# Patient Record
Sex: Male | Born: 1965
Health system: Southern US, Community
[De-identification: ages and names within clinical notes are randomized; demographics above are authoritative.]

## PROBLEM LIST (undated history)

## (undated) DIAGNOSIS — J45909 Unspecified asthma, uncomplicated: Secondary | ICD-10-CM

## (undated) DIAGNOSIS — IMO0002 Reserved for concepts with insufficient information to code with codable children: Secondary | ICD-10-CM

## (undated) DIAGNOSIS — J398 Other specified diseases of upper respiratory tract: Secondary | ICD-10-CM

## (undated) DIAGNOSIS — I509 Heart failure, unspecified: Secondary | ICD-10-CM

## (undated) DIAGNOSIS — F17200 Nicotine dependence, unspecified, uncomplicated: Secondary | ICD-10-CM

## (undated) DIAGNOSIS — J449 Chronic obstructive pulmonary disease, unspecified: Secondary | ICD-10-CM

## (undated) DIAGNOSIS — F1021 Alcohol dependence, in remission: Secondary | ICD-10-CM

## (undated) DIAGNOSIS — F411 Generalized anxiety disorder: Secondary | ICD-10-CM

## (undated) DIAGNOSIS — G43909 Migraine, unspecified, not intractable, without status migrainosus: Secondary | ICD-10-CM

## (undated) DIAGNOSIS — I10 Essential (primary) hypertension: Secondary | ICD-10-CM

## (undated) DIAGNOSIS — T1490XA Injury, unspecified, initial encounter: Secondary | ICD-10-CM

## (undated) DIAGNOSIS — R911 Solitary pulmonary nodule: Secondary | ICD-10-CM

## (undated) HISTORY — DX: Migraine, unspecified, not intractable, without status migrainosus: G43.909

## (undated) HISTORY — DX: Essential (primary) hypertension: I10

## (undated) HISTORY — DX: Generalized anxiety disorder: F41.1

## (undated) HISTORY — DX: Injury, unspecified, initial encounter: T14.90XA

## (undated) HISTORY — DX: Heart failure, unspecified: I50.9

## (undated) HISTORY — DX: Nicotine dependence, unspecified, uncomplicated: F17.200

## (undated) HISTORY — DX: Other specified diseases of upper respiratory tract: J39.8

## (undated) HISTORY — DX: Solitary pulmonary nodule: R91.1

## (undated) HISTORY — DX: Alcohol dependence, in remission: F10.21

## (undated) HISTORY — DX: Reserved for concepts with insufficient information to code with codable children: IMO0002

## (undated) HISTORY — DX: Chronic obstructive pulmonary disease, unspecified: J44.9

---

## 2004-07-03 ENCOUNTER — Ambulatory Visit: Payer: Self-pay | Admitting: Family Medicine

## 2004-09-20 ENCOUNTER — Ambulatory Visit: Payer: Self-pay | Admitting: Internal Medicine

## 2004-10-02 ENCOUNTER — Ambulatory Visit: Payer: Self-pay | Admitting: Family Medicine

## 2004-10-18 ENCOUNTER — Ambulatory Visit: Payer: Self-pay | Admitting: Internal Medicine

## 2005-02-26 ENCOUNTER — Ambulatory Visit: Payer: Self-pay | Admitting: Family Medicine

## 2005-05-23 ENCOUNTER — Ambulatory Visit: Payer: Self-pay | Admitting: Family Medicine

## 2005-08-22 ENCOUNTER — Ambulatory Visit: Payer: Self-pay | Admitting: Family Medicine

## 2006-01-01 ENCOUNTER — Ambulatory Visit: Payer: Self-pay | Admitting: Family Medicine

## 2006-01-10 ENCOUNTER — Ambulatory Visit: Payer: Self-pay | Admitting: Family Medicine

## 2006-01-30 ENCOUNTER — Ambulatory Visit: Payer: Self-pay

## 2006-07-24 ENCOUNTER — Ambulatory Visit: Payer: Self-pay | Admitting: Family Medicine

## 2011-02-10 DIAGNOSIS — T1490XA Injury, unspecified, initial encounter: Secondary | ICD-10-CM

## 2011-02-10 DIAGNOSIS — R911 Solitary pulmonary nodule: Secondary | ICD-10-CM

## 2011-02-10 DIAGNOSIS — J398 Other specified diseases of upper respiratory tract: Secondary | ICD-10-CM | POA: Insufficient documentation

## 2011-02-10 HISTORY — DX: Solitary pulmonary nodule: R91.1

## 2011-02-10 HISTORY — DX: Other specified diseases of upper respiratory tract: J39.8

## 2011-02-10 HISTORY — PX: OTHER SURGICAL HISTORY: SHX169

## 2011-02-10 HISTORY — DX: Injury, unspecified, initial encounter: T14.90XA

## 2011-03-05 LAB — CBC AND DIFFERENTIAL
MCV: 91 fL
RDW: 14.4
WBC: 11.8
platelet count: 509

## 2011-03-05 LAB — COMPREHENSIVE METABOLIC PANEL
Chloride: 100 mmol/L
Glucose: 110
Potassium: 4.7 mmol/L
Sodium: 138 mmol/L (ref 137–147)

## 2011-03-12 ENCOUNTER — Encounter: Payer: Self-pay | Admitting: Pain Medicine

## 2011-03-27 ENCOUNTER — Encounter: Payer: Self-pay | Admitting: Family Medicine

## 2011-03-27 ENCOUNTER — Ambulatory Visit (INDEPENDENT_AMBULATORY_CARE_PROVIDER_SITE_OTHER): Payer: Self-pay | Admitting: Family Medicine

## 2011-03-27 VITALS — BP 124/82 | HR 80 | Temp 97.6°F | Ht 74.0 in | Wt 248.0 lb

## 2011-03-27 DIAGNOSIS — F17200 Nicotine dependence, unspecified, uncomplicated: Secondary | ICD-10-CM

## 2011-03-27 DIAGNOSIS — J398 Other specified diseases of upper respiratory tract: Secondary | ICD-10-CM

## 2011-03-27 DIAGNOSIS — J984 Other disorders of lung: Secondary | ICD-10-CM

## 2011-03-27 DIAGNOSIS — T1490XA Injury, unspecified, initial encounter: Secondary | ICD-10-CM

## 2011-03-27 DIAGNOSIS — R9389 Abnormal findings on diagnostic imaging of other specified body structures: Secondary | ICD-10-CM

## 2011-03-27 DIAGNOSIS — Z720 Tobacco use: Secondary | ICD-10-CM | POA: Insufficient documentation

## 2011-03-27 DIAGNOSIS — F1021 Alcohol dependence, in remission: Secondary | ICD-10-CM

## 2011-03-27 DIAGNOSIS — R222 Localized swelling, mass and lump, trunk: Secondary | ICD-10-CM

## 2011-03-27 DIAGNOSIS — K219 Gastro-esophageal reflux disease without esophagitis: Secondary | ICD-10-CM | POA: Insufficient documentation

## 2011-03-27 DIAGNOSIS — IMO0002 Reserved for concepts with insufficient information to code with codable children: Secondary | ICD-10-CM

## 2011-03-27 DIAGNOSIS — J309 Allergic rhinitis, unspecified: Secondary | ICD-10-CM

## 2011-03-27 DIAGNOSIS — R911 Solitary pulmonary nodule: Secondary | ICD-10-CM

## 2011-03-27 MED ORDER — NAPROXEN 500 MG PO TABS: ORAL_TABLET | ORAL | Status: DC

## 2011-03-27 NOTE — Assessment & Plan Note (Signed)
Rpt neck CT.
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
## 2011-03-27 NOTE — Assessment & Plan Note (Signed)
Encouraged sobriety.

## 2011-03-27 NOTE — Patient Instructions (Addendum)
Pass by Marion's office to set up repeat CT scan of neck and kidney ultrasound. Good to meet you today, call us with questions.

## 2011-03-27 NOTE — Assessment & Plan Note (Addendum)
rec continued f/u with PT/OT as well as neurology. 1 suture removed today. Reviewed f/u plans from hosptial -  1. Solitary R 6mm pulm nodule, rec recheck CT scan 06/2011.  Never set up in [facility] pulm nodule clinic. 2. Right kidney hypodensity that needs f/u - ordered abd [place] to better visualize. 3. Tracheal debris, ? Mass.  rec rpt CT scan neck.  Ordered today. For blood in stool advised pt to return in 2 wks for recheck, will discuss then.  Longstanding.
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
## 2011-03-27 NOTE — Assessment & Plan Note (Signed)
Encouraged cessation, discussed ways to quit with this.

## 2011-03-27 NOTE — Assessment & Plan Note (Signed)
Rpt CT due ~06/2011

## 2011-03-27 NOTE — Patient Instructions (Addendum)
We need to set you up with repeat CT scan of lungs for January to follow spot that they saw. [facility] also recommended repeating neck CT to check spot on trachea and abdominal ultrasound to check spot on right kidney. We need to schedule you for colonoscopy.  We will do this when you return to get set up. You need to quit smoking! Pass by Marion's office to set up repeat CT scan of neck and kidney ultrasound.  Make appointment with neurologist to check on memory.  Keep appointments with physical therapy. Good to meet you today, call us with questions. Return to see me in 2-3 weeks to talk about all these issues (30 min appointment).

## 2011-03-28 ENCOUNTER — Encounter: Payer: Self-pay | Admitting: Family Medicine

## 2011-03-29 ENCOUNTER — Other Ambulatory Visit: Payer: Self-pay | Admitting: Family Medicine

## 2011-03-29 DIAGNOSIS — N289 Disorder of kidney and ureter, unspecified: Secondary | ICD-10-CM

## 2011-03-29 NOTE — Progress Notes (Signed)
  Subjective:    Patient ID: Chad Gibbs, male    DOB: [birth year], 45 y.o.   MRN: [id number]  HPI  ** THIS APPT ACCIDENTALLY CANCELLED  PT ARRIVED LATE.  SEE OTHER NOTE FOR FURTHER INFORMATION  Review of Systems     Objective:   Physical Exam        Assessment & Plan:

## 2011-04-10 ENCOUNTER — Ambulatory Visit (INDEPENDENT_AMBULATORY_CARE_PROVIDER_SITE_OTHER): Payer: Self-pay | Admitting: Family Medicine

## 2011-04-10 ENCOUNTER — Encounter: Payer: Self-pay | Admitting: Family Medicine

## 2011-04-10 VITALS — BP 144/84 | HR 76 | Temp 97.8°F | Wt 254.4 lb

## 2011-04-10 DIAGNOSIS — J984 Other disorders of lung: Secondary | ICD-10-CM

## 2011-04-10 DIAGNOSIS — Z1211 Encounter for screening for malignant neoplasm of colon: Secondary | ICD-10-CM

## 2011-04-10 DIAGNOSIS — J3489 Other specified disorders of nose and nasal sinuses: Secondary | ICD-10-CM

## 2011-04-10 DIAGNOSIS — K921 Melena: Secondary | ICD-10-CM | POA: Insufficient documentation

## 2011-04-10 DIAGNOSIS — J398 Other specified diseases of upper respiratory tract: Secondary | ICD-10-CM

## 2011-04-10 DIAGNOSIS — R911 Solitary pulmonary nodule: Secondary | ICD-10-CM

## 2011-04-10 DIAGNOSIS — F17200 Nicotine dependence, unspecified, uncomplicated: Secondary | ICD-10-CM

## 2011-04-10 DIAGNOSIS — R0981 Nasal congestion: Secondary | ICD-10-CM

## 2011-04-10 DIAGNOSIS — R222 Localized swelling, mass and lump, trunk: Secondary | ICD-10-CM

## 2011-04-10 NOTE — Assessment & Plan Note (Signed)
Continue to encourage cessation. 

## 2011-04-10 NOTE — Assessment & Plan Note (Signed)
Pending pulm CT due 06/2011.

## 2011-04-10 NOTE — Assessment & Plan Note (Signed)
Pending neck CT.

## 2011-04-10 NOTE — Assessment & Plan Note (Signed)
Anticipate early sinusitis.   Advised notify us if fever, not improving as expected. Offered abx, pt declines for now. rec robitussin, nasal saline.

## 2011-04-10 NOTE — Patient Instructions (Addendum)
Make appointment with neurology. Keep appointment tomorrow for check on kidneys at [facility].. Your stool tested positive for blood.  I do want to set you up with colonoscopy.  Pass by Marion's office to set this up. If congestion is not getting better, or fever >101 let me know. Try robitussin with plenty of fluid as well as nasal saline irrigation over the counter for sinus congestion.

## 2011-04-10 NOTE — Assessment & Plan Note (Signed)
hemoccult positive today in clinic.  Denies BM changes. Smoker. Refer for colonoscopy. Small ext hemorrhoid on exam today, not inflammed.
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
## 2011-04-12 ENCOUNTER — Encounter: Payer: Self-pay | Admitting: Pain Medicine

## 2011-05-14 ENCOUNTER — Other Ambulatory Visit: Payer: Self-pay | Admitting: *Deleted

## 2011-05-14 MED ORDER — NAPROXEN 500 MG PO TABS: 500.0000 mg | ORAL_TABLET | Freq: Two times a day (BID) | ORAL | Status: DC

## 2011-05-14 NOTE — Telephone Encounter (Signed)
Sent in

## 2011-05-14 NOTE — Telephone Encounter (Signed)
Received faxed refill request from pharmacy for Naproxen 500 mg, take one by mouth three times a day as needed for pain with food LR 03/27/2011.  Medication is not on medication sheet. Is it okay to refill medication

## 2012-02-24 ENCOUNTER — Encounter: Payer: Self-pay | Admitting: Family Medicine

## 2012-02-24 DIAGNOSIS — F411 Generalized anxiety disorder: Secondary | ICD-10-CM | POA: Insufficient documentation

## 2016-11-02 ENCOUNTER — Telehealth: Payer: Self-pay | Admitting: Family Medicine

## 2016-11-02 NOTE — Telephone Encounter (Signed)
Called to schedule NP Appt as patient requested.  Phone# [phone].  Patient did not answer telephone after 3 attempts and voicemail states" has not been set up yet".

## 2016-12-18 ENCOUNTER — Encounter: Payer: Self-pay | Admitting: Family Medicine

## 2016-12-18 ENCOUNTER — Ambulatory Visit (INDEPENDENT_AMBULATORY_CARE_PROVIDER_SITE_OTHER): Payer: Medicare HMO | Admitting: Family Medicine

## 2016-12-18 VITALS — BP 123/79 | HR 90 | Ht 70.6 in | Wt 251.2 lb

## 2016-12-18 DIAGNOSIS — Z125 Encounter for screening for malignant neoplasm of prostate: Secondary | ICD-10-CM | POA: Diagnosis not present

## 2016-12-18 DIAGNOSIS — S81802A Unspecified open wound, left lower leg, initial encounter: Secondary | ICD-10-CM

## 2016-12-18 DIAGNOSIS — Z13220 Encounter for screening for lipoid disorders: Secondary | ICD-10-CM

## 2016-12-18 DIAGNOSIS — E6609 Other obesity due to excess calories: Secondary | ICD-10-CM | POA: Diagnosis not present

## 2016-12-18 DIAGNOSIS — Z720 Tobacco use: Secondary | ICD-10-CM

## 2016-12-18 DIAGNOSIS — Z23 Encounter for immunization: Secondary | ICD-10-CM

## 2016-12-18 DIAGNOSIS — Z113 Encounter for screening for infections with a predominantly sexual mode of transmission: Secondary | ICD-10-CM | POA: Diagnosis not present

## 2016-12-18 DIAGNOSIS — K921 Melena: Secondary | ICD-10-CM

## 2016-12-18 DIAGNOSIS — Z1211 Encounter for screening for malignant neoplasm of colon: Secondary | ICD-10-CM

## 2016-12-18 DIAGNOSIS — R69 Illness, unspecified: Secondary | ICD-10-CM | POA: Diagnosis not present

## 2016-12-18 DIAGNOSIS — Z6835 Body mass index (BMI) 35.0-35.9, adult: Secondary | ICD-10-CM | POA: Diagnosis not present

## 2016-12-18 DIAGNOSIS — S069X9A Unspecified intracranial injury with loss of consciousness of unspecified duration, initial encounter: Secondary | ICD-10-CM | POA: Insufficient documentation

## 2016-12-18 DIAGNOSIS — K429 Umbilical hernia without obstruction or gangrene: Secondary | ICD-10-CM | POA: Insufficient documentation

## 2016-12-18 DIAGNOSIS — S069XAA Unspecified intracranial injury with loss of consciousness status unknown, initial encounter: Secondary | ICD-10-CM | POA: Insufficient documentation

## 2016-12-18 LAB — UA/M W/RFLX CULTURE, ROUTINE
BILIRUBIN UA: NEGATIVE
Glucose, UA: NEGATIVE
KETONES UA: NEGATIVE
Leukocytes, UA: NEGATIVE
Nitrite, UA: NEGATIVE
Protein, UA: NEGATIVE
Specific Gravity, UA: 1.02 (ref 1.005–1.030)
UUROB: 0.2 mg/dL (ref 0.2–1.0)
pH, UA: 5 (ref 5.0–7.5)

## 2016-12-18 LAB — BAYER DCA HB A1C WAIVED: HB A1C: 5.3 % (ref <7.0)
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
## 2016-12-18 NOTE — Assessment & Plan Note (Signed)
Continue diet and exercise. Continue to monitor. Checking labs today.

## 2016-12-18 NOTE — Assessment & Plan Note (Signed)
He is aware that we are here if he wants to quit. Not interested now. Wheezy today- will do spiro next visit.

## 2016-12-18 NOTE — Assessment & Plan Note (Signed)
Chronic. Has history of hemorrhoids. Never had a colonoscopy- referral generated today.

## 2016-12-18 NOTE — Assessment & Plan Note (Signed)
Will refer to general surgery. Referral generated today.

## 2016-12-18 NOTE — Patient Instructions (Addendum)
[facility]  [address] [place], [place] [address] Phone: [phone]   Td Vaccine (Tetanus and Diphtheria): What You Need to Know 1. Why get vaccinated? Tetanus  and diphtheria are very serious diseases. They are rare in the [place] today, but people who do become infected often have severe complications. Td vaccine is used to protect adolescents and adults from both of these diseases. Both tetanus and diphtheria are infections caused by bacteria. Diphtheria spreads from person to person through coughing or sneezing. Tetanus-causing bacteria enter the body through cuts, scratches, or wounds. TETANUS (lockjaw) causes painful muscle tightening and stiffness, usually all over the body.  It can lead to tightening of muscles in the head and neck so you can't open your mouth, swallow, or sometimes even breathe. Tetanus kills about 1 out of every 10 people who are infected even after receiving the best medical care.  DIPHTHERIA can cause a thick coating to form in the back of the throat.  It can lead to breathing problems, paralysis, heart failure, and death.  Before vaccines, as many as 200,000 cases of diphtheria and hundreds of cases of tetanus were reported in the [place] each year. Since vaccination began, reports of cases for both diseases have dropped by about 99%. 2. Td vaccine Td vaccine can protect adolescents and adults from tetanus and diphtheria. Td is usually given as a booster dose every 10 years but it can also be given earlier after a severe and dirty wound or burn. Another vaccine, called Tdap, which protects against pertussis in addition to tetanus and diphtheria, is sometimes recommended instead of Td vaccine. Your doctor or the person giving you the vaccine can give you more information. Td may safely be given at the same time as other vaccines. 3. Some people should not get this vaccine  A person who has ever had a life-threatening allergic  reaction after a previous dose of any tetanus or diphtheria containing vaccine, OR has a severe allergy to any part of this vaccine, should not get Td vaccine. Tell the person giving the vaccine about any severe allergies.  Talk to your doctor if you: ? had severe pain or swelling after any vaccine containing diphtheria or tetanus, ? ever had a condition called Guillain Barre Syndrome (GBS), ? aren't feeling well on the day the shot is scheduled. 4. What are the risks from Td vaccine? With any medicine, including vaccines, there is a chance of side effects. These are usually mild and go away on their own. Serious reactions are also possible but are rare. Most people who get Td vaccine do not have any problems with it. Mild problems following Td vaccine: (Did not interfere with activities)  Pain where the shot was given (about 8 people in 10)  Redness or swelling where the shot was given (about 1 person in 4)  Mild fever (rare)  Headache (about 1 person in 4)  Tiredness (about 1 person in 4)  Moderate problems following Td vaccine: (Interfered with activities, but did not require medical attention)  Fever over 102F (rare)  Severe problems following Td vaccine: (Unable to perform usual activities; required medical attention)  Swelling, severe pain, bleeding and/or redness in the arm where the shot was given (rare).  Problems that could happen after any vaccine:  People sometimes faint after a medical procedure, including vaccination. Sitting or lying down for about 15 minutes can help prevent fainting, and injuries caused by a fall. Tell your doctor if you feel dizzy,  or have vision changes or ringing in the ears.  Some people get severe pain in the shoulder and have difficulty moving the arm where a shot was given. This happens very rarely.  Any medication can cause a severe allergic reaction. Such reactions from a vaccine are very rare, estimated at fewer than 1 in a million  doses, and would happen within a few minutes to a few hours after the vaccination. As with any medicine, there is a very remote chance of a vaccine causing a serious injury or death. The safety of vaccines is always being monitored. For more information, visit: http://floyd.org/www.cdc.gov/vaccinesafety/ 5. What if there is a serious reaction? What should I look for? Look for anything that concerns you, such as signs of a severe allergic reaction, very high fever, or unusual behavior. Signs of a severe allergic reaction can include hives, swelling of the face and throat, difficulty breathing, a fast heartbeat, dizziness, and weakness. These would usually start a few minutes to a few hours after the vaccination. What should I do?  If you think it is a severe allergic reaction or other emergency that can't wait, call 9-1-1 or get the person to the nearest hospital. Otherwise, call your doctor.  Afterward, the reaction should be reported to the Vaccine Adverse Event Reporting System (VAERS). Your doctor might file this report, or you can do it yourself through the VAERS web site at www.vaers.LAgents.nohhs.gov, or by calling 1-857-547-0211. ? VAERS does not give medical advice. 6. The National Vaccine Injury Compensation Program The [employer] Vaccine Injury Compensation Program (VICP) is a federal program that was created to compensate people who may have been injured by certain vaccines. Persons who believe they may have been injured by a vaccine can learn about the program and about filing a claim by calling 1-442-197-8120 or visiting the VICP website at SpiritualWord.atwww.hrsa.gov/vaccinecompensation. There is a time limit to file a claim for compensation. 7. How can I learn more?  Ask your doctor. He or she can give you the vaccine package insert or suggest other sources of information.  Call your local or state health department.  Contact the Centers for Disease Control and Prevention (CDC): ? Call (406)564-74091-(480)386-3023  (1-800-CDC-INFO) ? Visit CDC's website at PicCapture.uywww.cdc.gov/vaccines CDC Td Vaccine VIS (09/20/15) This information is not intended to replace advice given to you by your health care provider. Make sure you discuss any questions you have with your health care provider. Document Released: 03/25/2006 Document Revised: 02/16/2016 Document Reviewed: 02/16/2016 Elsevier Interactive Patient Education  2017 [employer]

## 2016-12-18 NOTE — Assessment & Plan Note (Signed)
Stable. Continue to monitor. Has help at home.

## 2016-12-19 ENCOUNTER — Encounter: Payer: Self-pay | Admitting: Family Medicine

## 2016-12-19 LAB — COMPREHENSIVE METABOLIC PANEL
ALT: 18 IU/L (ref 0–44)
AST: 17 IU/L (ref 0–40)
Albumin/Globulin Ratio: 1.8 (ref 1.2–2.2)
Albumin: 4.6 g/dL (ref 3.5–5.5)
Alkaline Phosphatase: 82 IU/L (ref 39–117)
BUN/Creatinine Ratio: 16 (ref 9–20)
BUN: 14 mg/dL (ref 6–24)
Bilirubin Total: 0.4 mg/dL (ref 0.0–1.2)
CHLORIDE: 101 mmol/L (ref 96–106)
CO2: 26 mmol/L (ref 20–29)
CREATININE: 0.88 mg/dL (ref 0.76–1.27)
Calcium: 9.4 mg/dL (ref 8.7–10.2)
GFR, EST AFRICAN AMERICAN: 113 mL/min/{1.73_m2} (ref >59)
GFR, EST NON AFRICAN AMERICAN: 98 mL/min/{1.73_m2} (ref >59)
GLUCOSE: 73 mg/dL (ref 65–99)
Globulin, Total: 2.5 g/dL (ref 1.5–4.5)
Potassium: 4 mmol/L (ref 3.5–5.2)
Sodium: 143 mmol/L (ref 134–144)
TOTAL PROTEIN: 7.1 g/dL (ref 6.0–8.5)

## 2016-12-19 LAB — CBC WITH DIFFERENTIAL/PLATELET
BASOS ABS: 0.1 10*3/uL (ref 0.0–0.2)
Basos: 1 %
EOS (ABSOLUTE): 0.4 10*3/uL (ref 0.0–0.4)
Eos: 4 %
Hematocrit: 42.9 % (ref 37.5–51.0)
Hemoglobin: 14.3 g/dL (ref 13.0–17.7)
IMMATURE GRANS (ABS): 0 10*3/uL (ref 0.0–0.1)
IMMATURE GRANULOCYTES: 0 %
LYMPHS: 33 %
Lymphocytes Absolute: 3.2 10*3/uL — ABNORMAL HIGH (ref 0.7–3.1)
MCH: 29.4 pg (ref 26.6–33.0)
MCHC: 33.3 g/dL (ref 31.5–35.7)
MCV: 88 fL (ref 79–97)
MONOCYTES: 8 %
Monocytes Absolute: 0.8 10*3/uL (ref 0.1–0.9)
NEUTROS PCT: 54 %
Neutrophils Absolute: 5.4 10*3/uL (ref 1.4–7.0)
PLATELETS: 272 10*3/uL (ref 150–379)
RBC: 4.86 x10E6/uL (ref 4.14–5.80)
RDW: 14.8 % (ref 12.3–15.4)
WBC: 9.8 10*3/uL (ref 3.4–10.8)

## 2016-12-19 LAB — LIPID PANEL W/O CHOL/HDL RATIO
Cholesterol, Total: 176 mg/dL (ref 100–199)
HDL: 37 mg/dL — AB (ref >39)
LDL Calculated: 95 mg/dL (ref 0–99)
Triglycerides: 221 mg/dL — ABNORMAL HIGH (ref 0–149)
VLDL CHOLESTEROL CAL: 44 mg/dL — AB (ref 5–40)

## 2016-12-19 LAB — HIV ANTIBODY (ROUTINE TESTING W REFLEX): HIV Screen 4th Generation wRfx: NONREACTIVE

## 2016-12-19 LAB — HEPATITIS C ANTIBODY

## 2016-12-19 LAB — TSH: TSH: 2.27 u[IU]/mL (ref 0.450–4.500)

## 2016-12-19 LAB — PSA: Prostate Specific Ag, Serum: 1.1 ng/mL (ref 0.0–4.0)

## 2016-12-25 ENCOUNTER — Encounter: Payer: Self-pay | Admitting: *Deleted

## 2016-12-26 ENCOUNTER — Encounter: Payer: Self-pay | Admitting: General Surgery

## 2016-12-26 ENCOUNTER — Ambulatory Visit (INDEPENDENT_AMBULATORY_CARE_PROVIDER_SITE_OTHER): Payer: Medicare HMO | Admitting: General Surgery

## 2016-12-26 VITALS — BP 142/80 | HR 70 | Resp 12 | Ht 72.0 in | Wt 249.0 lb

## 2016-12-26 DIAGNOSIS — K429 Umbilical hernia without obstruction or gangrene: Secondary | ICD-10-CM | POA: Diagnosis not present

## 2016-12-26 NOTE — Patient Instructions (Signed)
Umbilical Hernia, Adult A hernia is a bulge of tissue that pushes through an opening between muscles. An umbilical hernia happens in the abdomen, near the belly button (umbilicus). The hernia may contain tissues from the small intestine, large intestine, or fatty tissue covering the intestines (omentum). Umbilical hernias in adults tend to get worse over time, and they require surgical treatment. There are several types of umbilical hernias. You may have:  A hernia located just above or below the umbilicus (indirect hernia). This is the most common type of umbilical hernia in adults.  A hernia that forms through an opening formed by the umbilicus (direct hernia).  A hernia that comes and goes (reducible hernia). A reducible hernia may be visible only when you strain, lift something heavy, or cough. This type of hernia can be pushed back into the abdomen (reduced).  A hernia that traps abdominal tissue inside the hernia (incarcerated hernia). This type of hernia cannot be reduced.  A hernia that cuts off blood flow to the tissues inside the hernia (strangulated hernia). The tissues can start to die if this happens. This type of hernia requires emergency treatment.  What are the causes? An umbilical hernia happens when tissue inside the abdomen presses on a weak area of the abdominal muscles. What increases the risk? You may have a greater risk of this condition if you:  Are obese.  Have had several pregnancies.  Have a buildup of fluid inside your abdomen (ascites).  Have had surgery that weakens the abdominal muscles.  What are the signs or symptoms? The main symptom of this condition is a painless bulge at or near the belly button. A reducible hernia may be visible only when you strain, lift something heavy, or cough. Other symptoms may include:  Dull pain.  A feeling of pressure.  Symptoms of a strangulated hernia may include:  Pain that gets increasingly worse.  Nausea and  vomiting.  Pain when pressing on the hernia.  Skin over the hernia becoming red or purple.  Constipation.  Blood in the stool.  How is this diagnosed? This condition may be diagnosed based on:  A physical exam. You may be asked to cough or strain while standing. These actions increase the pressure inside your abdomen and force the hernia through the opening in your muscles. Your health care provider may try to reduce the hernia by pressing on it.  Your symptoms and medical history.  How is this treated? Surgery is the only treatment for an umbilical hernia. Surgery for a strangulated hernia is done as soon as possible. If you have a small hernia that is not incarcerated, you may need to lose weight before having surgery. Follow these instructions at home:  Lose weight, if told by your health care provider.  Do not try to push the hernia back in.  Watch your hernia for any changes in color or size. Tell your health care provider if any changes occur.  You may need to avoid activities that increase pressure on your hernia.  Do not lift anything that is heavier than 10 lb (4.5 kg) until your health care provider says that this is safe.  Take over-the-counter and prescription medicines only as told by your health care provider.  Keep all follow-up visits as told by your health care provider. This is important. Contact a health care provider if:  Your hernia gets larger.  Your hernia becomes painful. Get help right away if:  You develop sudden, severe pain near the   area of your hernia.  You have pain as well as nausea or vomiting.  You have pain and the skin over your hernia changes color.  You develop a fever. This information is not intended to replace advice given to you by your health care provider. Make sure you discuss any questions you have with your health care provider. Document Released: 10/28/2015 Document Revised: 01/29/2016 Document Reviewed:  10/28/2015 Elsevier Interactive Patient Education  2018 Elsevier Inc.  
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
## 2016-12-27 ENCOUNTER — Telehealth: Payer: Self-pay | Admitting: Family Medicine

## 2016-12-27 NOTE — Telephone Encounter (Signed)
Pt would like to have smoking sensation patches sent to [facility]. He would also like to know if he could start back drinking tap water.

## 2016-12-27 NOTE — Telephone Encounter (Signed)
Can you please find out how much he's smoking so I can send in the right dose on the patch?  I don't know why he's not drinking tap water- can you find out who told him not to and why so I can tell him if it's OK to drink the water or not? Thanks!

## 2016-12-27 NOTE — Telephone Encounter (Signed)
Attempted to reach pt. VM has not yet been set up, was unable to leave message.

## 2016-12-27 NOTE — Telephone Encounter (Signed)
Please advise. I see no notes in regards to pt not drinking tap water.

## 2016-12-28 MED ORDER — NICOTINE 21 MG/24HR TD PT24: 21.0000 mg | MEDICATED_PATCH | Freq: Every day | TRANSDERMAL | 3 refills | Status: DC

## 2016-12-28 NOTE — Telephone Encounter (Signed)
Agreed -

## 2016-12-28 NOTE — Telephone Encounter (Signed)
Patches sent to his pharmacy. I see no reason why he can't drink tap water. Thanks!

## 2016-12-28 NOTE — Telephone Encounter (Signed)
Patient notified.  Patient states that he is drinking well water, he wanted to know if that was safe for him to drink. I informed him that there was no way to know if this was safe for him to drink without having it tested and know what is actually in the water, suggested for patient to continue to buy drinking water until he is able to have the water tested.

## 2016-12-28 NOTE — Telephone Encounter (Signed)
Spoke with patient he smoke about a pack to a pack and a half of cigarettes a day.  He states that he was drinking about 2 gallons of water a day to help with weight loss.

## 2017-01-03 ENCOUNTER — Ambulatory Visit (INDEPENDENT_AMBULATORY_CARE_PROVIDER_SITE_OTHER): Payer: Medicare HMO

## 2017-01-03 VITALS — BP 118/70 | HR 73 | Temp 97.4°F | Resp 16 | Ht 71.0 in | Wt 248.4 lb

## 2017-01-03 DIAGNOSIS — Z0000 Encounter for general adult medical examination without abnormal findings: Secondary | ICD-10-CM
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
## 2017-01-03 NOTE — Patient Instructions (Addendum)
Chad Gibbs , Thank you for taking time to come for your Medicare Wellness Visit. I appreciate your ongoing commitment to your health goals. Please review the following plan we discussed and let me know if I can assist you in the future.   Screening recommendations/referrals: Colonoscopy: declined at this time Recommended yearly ophthalmology/optometry visit for glaucoma screening and checkup Recommended yearly dental visit for hygiene and checkup  Vaccinations: Influenza vaccine: due 02/2017 Pneumococcal vaccine: due at age 51 Tdap vaccine: up to date Shingles vaccine: due at age 51  Advanced directives: Advance directive discussed with you today. I have provided a copy for you to complete at home and have notarized. Once this is complete please bring a copy in to our office so we can scan it into your chart.  Conditions/risks identified: smoking cessation discussed- [place] smoking cessation classes provided.   Next appointment: Follow up on 01/07/2017 at 10:30am with Dr.Johnson. Follow up in one year for your annual wellness exam.   Preventive Care 40-64 Years, Male Preventive care refers to lifestyle choices and visits with your health care provider that can promote health and wellness. What does preventive care include?  A yearly physical exam. This is also called an annual well check.  Dental exams once or twice a year.  Routine eye exams. Ask your health care provider how often you should have your eyes checked.  Personal lifestyle choices, including:  Daily care of your teeth and gums.  Regular physical activity.  Eating a healthy diet.  Avoiding tobacco and drug use.  Limiting alcohol use.  Practicing safe sex.  Taking low-dose aspirin every day starting at age 850. What happens during an annual well check? The services and screenings done by your health care provider during your annual well check will depend on your age, overall health, lifestyle risk factors,  and family history of disease. Counseling  Your health care provider may ask you questions about your:  Alcohol use.  Tobacco use.  Drug use.  Emotional well-being.  Home and relationship well-being.  Sexual activity.  Eating habits.  Work and work Astron[employer]vironment. Screening  You may have the following tests or measurements:  Height, weight, and BMI.  Blood pressure.  Lipid and cholesterol levels. These may be checked every 5 years, or more frequently if you are over 92 years old.  Skin check.  Lung cancer screening. You may have this screening every year starting at age 53 if you have a 30-pack-year history of smoking and currently smoke or have quit within the past 15 years.  Fecal occult blood test (FOBT) of the stool. You may have this test every year starting at age 53.  Flexible sigmoidoscopy or colonoscopy. You may have a sigmoidoscopy every 5 years or a colonoscopy every 10 years starting at age 53.  Prostate cancer screening. Recommendations will vary depending on your family history and other risks.  Hepatitis C blood test.  Hepatitis B blood test.  Sexually transmitted disease (STD) testing.  Diabetes screening. This is done by checking your blood sugar (glucose) after you have not eaten for a while (fasting). You may have this done every 1-3 years. Discuss your test results, treatment options, and if necessary, the need for more tests with your health care provider. Vaccines  Your health care provider may recommend certain vaccines, such as:  Influenza vaccine. This is recommended every year.  Tetanus, diphtheria, and acellular pertussis (Tdap, Td) vaccine. You may need a Td booster every 10 years.  Zoster vaccine. You may need this after age 60.  Pneumococcal 13-valent conjugate (PCV13) vaccine. You may need this if you have certain conditions and have not been vaccinated.  Pneumococcal polysaccharide (PPSV23) vaccine. You may need one or two doses if  you smoke cigarettes or if you have certain conditions. Talk to your health care provider about which screenings and vaccines you need and how often you need them. This information is not intended to replace advice given to you by your health care provider. Make sure you discuss any questions you have with your health care provider. Document Released: 06/24/2015 Document Revised: 02/15/2016 Document Reviewed: 03/29/2015 Elsevier Interactive Patient Education  2017 [employer].  Fall Prevention in the Home Falls can cause injuries. They can happen to people of all ages. There are many things you can do to make your home safe and to help prevent falls. What can I do on the outside of my home?  Regularly fix the edges of walkways and driveways and fix any cracks.  Remove anything that might make you trip as you walk through a door, such as a raised step or threshold.  Trim any bushes or trees on the path to your home.  Use bright outdoor lighting.  Clear any walking paths of anything that might make someone trip, such as rocks or tools.  Regularly check to see if handrails are loose or broken. Make sure that both sides of any steps have handrails.  Any raised decks and porches should have guardrails on the edges.  Have any leaves, snow, or ice cleared regularly.  Use sand or salt on walking paths during winter.  Clean up any spills in your garage right away. This includes oil or grease spills. What can I do in the bathroom?  Use night lights.  Install grab bars by the toilet and in the tub and shower. Do not use towel bars as grab bars.  Use non-skid mats or decals in the tub or shower.  If you need to sit down in the shower, use a plastic, non-slip stool.  Keep the floor dry. Clean up any water that spills on the floor as soon as it happens.  Remove soap buildup in the tub or shower regularly.  Attach bath mats securely with double-sided non-slip rug tape.  Do not have  throw rugs and other things on the floor that can make you trip. What can I do in the bedroom?  Use night lights.  Make sure that you have a light by your bed that is easy to reach.  Do not use any sheets or blankets that are too big for your bed. They should not hang down onto the floor.  Have a firm chair that has side arms. You can use this for support while you get dressed.  Do not have throw rugs and other things on the floor that can make you trip. What can I do in the kitchen?  Clean up any spills right away.  Avoid walking on wet floors.  Keep items that you use a lot in easy-to-reach places.  If you need to reach something above you, use a strong step stool that has a grab bar.  Keep electrical cords out of the way.  Do not use floor polish or wax that makes floors slippery. If you must use wax, use non-skid floor wax.  Do not have throw rugs and other things on the floor that can make you trip. What can I do with  my stairs?  Do not leave any items on the stairs.  Make sure that there are handrails on both sides of the stairs and use them. Fix handrails that are broken or loose. Make sure that handrails are as long as the stairways.  Check any carpeting to make sure that it is firmly attached to the stairs. Fix any carpet that is loose or worn.  Avoid having throw rugs at the top or bottom of the stairs. If you do have throw rugs, attach them to the floor with carpet tape.  Make sure that you have a light switch at the top of the stairs and the bottom of the stairs. If you do not have them, ask someone to add them for you. What else can I do to help prevent falls?  Wear shoes that:  Do not have high heels.  Have rubber bottoms.  Are comfortable and fit you well.  Are closed at the toe. Do not wear sandals.  If you use a stepladder:  Make sure that it is fully opened. Do not climb a closed stepladder.  Make sure that both sides of the stepladder are  locked into place.  Ask someone to hold it for you, if possible.  Clearly mark and make sure that you can see:  Any grab bars or handrails.  First and last steps.  Where the edge of each step is.  Use tools that help you move around (mobility aids) if they are needed. These include:  Canes.  Walkers.  Scooters.  Crutches.  Turn on the lights when you go into a dark area. Replace any light bulbs as soon as they burn out.  Set up your furniture so you have a clear path. Avoid moving your furniture around.  If any of your floors are uneven, fix them.  If there are any pets around you, be aware of where they are.  Review your medicines with your doctor. Some medicines can make you feel dizzy. This can increase your chance of falling. Ask your doctor what other things that you can do to help prevent falls. This information is not intended to replace advice given to you by your health care provider. Make sure you discuss any questions you have with your health care provider. Document Released: 03/24/2009 Document Revised: 11/03/2015 Document Reviewed: 07/02/2014 Elsevier Interactive Patient Education  2017 [employer].     Steps to Quit Smoking Smoking tobacco can be bad for your health. It can also affect almost every organ in your body. Smoking puts you and people around you at risk for many serious long-lasting (chronic) diseases. Quitting smoking is hard, but it is one of the best things that you can do for your health. It is never too late to quit. What are the benefits of quitting smoking? When you quit smoking, you lower your risk for getting serious diseases and conditions. They can include:  Lung cancer or lung disease.  Heart disease.  Stroke.  Heart attack.  Not being able to have children (infertility).  Weak bones (osteoporosis) and broken bones (fractures).  If you have coughing, wheezing, and shortness of breath, those symptoms may get better when  you quit. You may also get sick less often. If you are pregnant, quitting smoking can help to lower your chances of having a baby of low birth weight. What can I do to help me quit smoking? Talk with your doctor about what can help you quit smoking. Some things you can do (  strategies) include:  Quitting smoking totally, instead of slowly cutting back how much you smoke over a period of time.  Going to in-person counseling. You are more likely to quit if you go to many counseling sessions.  Using resources and support systems, such as: ? Online chats with a Veterinary surgeoncounselor. ? Phone quitlines. ? [employer] Agricultural engineerself-help materials. ? Support groups or group counseling. ? Text messaging programs. ? Mobile phone apps or applications.  Taking medicines. Some of these medicines may have nicotine in them. If you are pregnant or breastfeeding, do not take any medicines to quit smoking unless your doctor says it is okay. Talk with your doctor about counseling or other things that can help you.  Talk with your doctor about using more than one strategy at the same time, such as taking medicines while you are also going to in-person counseling. This can help make quitting easier. What things can I do to make it easier to quit? Quitting smoking might feel very hard at first, but there is a lot that you can do to make it easier. Take these steps:  Talk to your family and friends. Ask them to support and encourage you.  Call phone quitlines, reach out to support groups, or work with a Veterinary surgeoncounselor.  Ask people who smoke to not smoke around you.  Avoid places that make you want (trigger) to smoke, such as: ? Bars. ? Parties. ? Smoke-break areas at work.  Spend time with people who do not smoke.  Lower the stress in your life. Stress can make you want to smoke. Try these things to help your stress: ? Getting regular exercise. ? Deep-breathing exercises. ? Yoga. ? Meditating. ? Doing a body scan. To do this,  close your eyes, focus on one area of your body at a time from head to toe, and notice which parts of your body are tense. Try to relax the muscles in those areas.  Download or buy apps on your mobile phone or tablet that can help you stick to your quit plan. There are many free apps, such as QuitGuide from the [employer] developer(Centers for Disease Control and Prevention). You can find more support from smokefree.gov and other websites.  This information is not intended to replace advice given to you by your health care provider. Make sure you discuss any questions you have with your health care provider. Document Released: 03/24/2009 Document Revised: 01/24/2016 Document Reviewed: 10/12/2014 Elsevier Interactive Patient Education  2018 [employer]

## 2017-01-07 ENCOUNTER — Telehealth: Payer: Self-pay | Admitting: Family Medicine

## 2017-01-07 ENCOUNTER — Encounter: Payer: Self-pay | Admitting: Family Medicine

## 2017-01-07 ENCOUNTER — Ambulatory Visit (INDEPENDENT_AMBULATORY_CARE_PROVIDER_SITE_OTHER): Payer: Medicare HMO | Admitting: Family Medicine

## 2017-01-07 VITALS — BP 113/83 | HR 83 | Temp 97.8°F | Ht 71.4 in | Wt 248.0 lb

## 2017-01-07 DIAGNOSIS — R062 Wheezing: Secondary | ICD-10-CM

## 2017-01-07 DIAGNOSIS — R911 Solitary pulmonary nodule: Secondary | ICD-10-CM | POA: Diagnosis not present

## 2017-01-07 DIAGNOSIS — Z0000 Encounter for general adult medical examination without abnormal findings: Secondary | ICD-10-CM

## 2017-01-07 DIAGNOSIS — R69 Illness, unspecified: Secondary | ICD-10-CM | POA: Diagnosis not present

## 2017-01-07 DIAGNOSIS — J398 Other specified diseases of upper respiratory tract: Secondary | ICD-10-CM

## 2017-01-07 DIAGNOSIS — J449 Chronic obstructive pulmonary disease, unspecified: Secondary | ICD-10-CM

## 2017-01-07 DIAGNOSIS — Z23 Encounter for immunization: Secondary | ICD-10-CM

## 2017-01-07 DIAGNOSIS — Z720 Tobacco use: Secondary | ICD-10-CM | POA: Diagnosis not present

## 2017-01-07 DIAGNOSIS — Z0001 Encounter for general adult medical examination with abnormal findings: Secondary | ICD-10-CM

## 2017-01-07 DIAGNOSIS — F1021 Alcohol dependence, in remission: Secondary | ICD-10-CM

## 2017-01-07 MED ORDER — ALBUTEROL SULFATE HFA 108 (90 BASE) MCG/ACT IN AERS: 2.0000 | INHALATION_SPRAY | Freq: Four times a day (QID) | RESPIRATORY_TRACT | 0 refills | Status: DC | PRN

## 2017-01-07 MED ORDER — BUDESONIDE-FORMOTEROL FUMARATE 160-4.5 MCG/ACT IN AERO: 2.0000 | INHALATION_SPRAY | Freq: Two times a day (BID) | RESPIRATORY_TRACT | 3 refills | Status: DC

## 2017-01-07 NOTE — Assessment & Plan Note (Addendum)
Does not appear that he had his follow up CT- ordered today. Also due for lung cancer screening- this should qualify.

## 2017-01-07 NOTE — Assessment & Plan Note (Signed)
In remission. Continue to monitor. Labs normal.

## 2017-01-07 NOTE — Telephone Encounter (Signed)
Called patient to advise that he left Symbicort samples at check out.  Unable to leave message as "voicemail has not been set up".  Will try to reach patient at a later time. Will leave samples at front desk with patient's name on them.

## 2017-01-07 NOTE — Assessment & Plan Note (Signed)
Diagnosed with spirometry today. Will start him on advair and albuterol and recheck in 1 month.

## 2017-01-07 NOTE — Patient Instructions (Addendum)
Chad Gibbs, you are due for 2 follow up Cat-scans for something that was seen in your neck and your chest when you saw Dr. Sharen HonesGutierrez in 2012. You will be getting a call from the hospital to schedule these. They are important. Please schedule them.   Preventative Services:  Health Risk Assessment and Personalized Prevention Plan: Done today Bone Mass Measurements: N/A Breast Cancer Screening: N/A CVD Screening: Done previously Cervical Cancer Screening: N/A Colon Cancer Screening: Ordered last visit Depression Screening: Up to date Diabetes Screening: Up to date Glaucoma Screening: See your eye doctor Hepatitis B vaccine: N/A Hepatitis C screening: Up to date HIV Screening: Up to date Flu Vaccine: Get in October Lung cancer Screening: Ordered today Obesity Screening: Done today Pneumonia Vaccines (2): Given today- due for 2nd at 63 STI Screening: N/A PSA screening: Up to date Health Maintenance, Male A healthy lifestyle and preventive care is important for your health and wellness. Ask your health care provider about what schedule of regular examinations is right for you. What should I know about weight and diet? Eat a Healthy Diet  Eat plenty of vegetables, fruits, whole grains, low-fat dairy products, and lean protein.  Do not eat a lot of foods high in solid fats, added sugars, or salt.  Maintain a Healthy Weight Regular exercise can help you achieve or maintain a healthy weight. You should:  Do at least 150 minutes of exercise each week. The exercise should increase your heart rate and make you sweat (moderate-intensity exercise).  Do strength-training exercises at least twice a week.  Watch Your Levels of Cholesterol and Blood Lipids  Have your blood tested for lipids and cholesterol every 5 years starting at 53 years of age. If you are at high risk for heart disease, you should start having your blood tested when you are 53 years old. You may need to have your cholesterol  levels checked more often if: ? Your lipid or cholesterol levels are high. ? You are older than 53 years of age. ? You are at high risk for heart disease.  What should I know about cancer screening? Many types of cancers can be detected early and may often be prevented. Lung Cancer  You should be screened every year for lung cancer if: ? You are a current smoker who has smoked for at least 30 years. ? You are a former smoker who has quit within the past 15 years.  Talk to your health care provider about your screening options, when you should start screening, and how often you should be screened.  Colorectal Cancer  Routine colorectal cancer screening usually begins at 53 years of age and should be repeated every 5-10 years until you are 53 years old. You may need to be screened more often if early forms of precancerous polyps or small growths are found. Your health care provider may recommend screening at an earlier age if you have risk factors for colon cancer.  Your health care provider may recommend using home test kits to check for hidden blood in the stool.  A small camera at the end of a tube can be used to examine your colon (sigmoidoscopy or colonoscopy). This checks for the earliest forms of colorectal cancer.  Prostate and Testicular Cancer  Depending on your age and overall health, your health care provider may do certain tests to screen for prostate and testicular cancer.  Talk to your health care provider about any symptoms or concerns you have about testicular or prostate  cancer.  Skin Cancer  Check your skin from head to toe regularly.  Tell your health care provider about any new moles or changes in moles, especially if: ? There is a change in a mole's size, shape, or color. ? You have a mole that is larger than a pencil eraser.  Always use sunscreen. Apply sunscreen liberally and repeat throughout the day.  Protect yourself by wearing long sleeves, pants, a  wide-brimmed hat, and sunglasses when outside.  What should I know about heart disease, diabetes, and high blood pressure?  If you are 19-72 years of age, have your blood pressure checked every 3-5 years. If you are 55 years of age or older, have your blood pressure checked every year. You should have your blood pressure measured twice-once when you are at a hospital or clinic, and once when you are not at a hospital or clinic. Record the average of the two measurements. To check your blood pressure when you are not at a hospital or clinic, you can use: ? An automated blood pressure machine at a pharmacy. ? A home blood pressure monitor.  Talk to your health care provider about your target blood pressure.  If you are between 36-70 years old, ask your health care provider if you should take aspirin to prevent heart disease.  Have regular diabetes screenings by checking your fasting blood sugar level. ? If you are at a normal weight and have a low risk for diabetes, have this test once every three years after the age of 56. ? If you are overweight and have a high risk for diabetes, consider being tested at a younger age or more often.  A one-time screening for abdominal aortic aneurysm (AAA) by ultrasound is recommended for men aged 65-75 years who are current or former smokers. What should I know about preventing infection? Hepatitis B If you have a higher risk for hepatitis B, you should be screened for this virus. Talk with your health care provider to find out if you are at risk for hepatitis B infection. Hepatitis C Blood testing is recommended for:  Everyone born from [birth year] through 1967.  Anyone with known risk factors for hepatitis C.  Sexually Transmitted Diseases (STDs)  You should be screened each year for STDs including gonorrhea and chlamydia if: ? You are sexually active and are younger than 53 years of age. ? You are older than 53 years of age and your health care provider  tells you that you are at risk for this type of infection. ? Your sexual activity has changed since you were last screened and you are at an increased risk for chlamydia or gonorrhea. Ask your health care provider if you are at risk.  Talk with your health care provider about whether you are at high risk of being infected with HIV. Your health care provider may recommend a prescription medicine to help prevent HIV infection.  What else can I do?  Schedule regular health, dental, and eye exams.  Stay current with your vaccines (immunizations).  Do not use any tobacco products, such as cigarettes, chewing tobacco, and e-cigarettes. If you need help quitting, ask your health care provider.  Limit alcohol intake to no more than 2 drinks per day. One drink equals 12 ounces of beer, 5 ounces of wine, or 1 ounces of hard liquor.  Do not use street drugs.  Do not share needles.  Ask your health care provider for help if you need support or information  about quitting drugs.  Tell your health care provider if you often feel depressed.  Tell your health care provider if you have ever been abused or do not feel safe at home. This information is not intended to replace advice given to you by your health care provider. Make sure you discuss any questions you have with your health care provider. Document Released: 11/24/2007 Document Revised: 01/25/2016 Document Reviewed: 03/01/2015 Elsevier Interactive Patient Education  2018 [employer].  Steps to Quit Smoking Smoking tobacco can be bad for your health. It can also affect almost every organ in your body. Smoking puts you and people around you at risk for many serious long-lasting (chronic) diseases. Quitting smoking is hard, but it is one of the best things that you can do for your health. It is never too late to quit. What are the benefits of quitting smoking? When you quit smoking, you lower your risk for getting serious diseases and  conditions. They can include:  Lung cancer or lung disease.  Heart disease.  Stroke.  Heart attack.  Not being able to have children (infertility).  Weak bones (osteoporosis) and broken bones (fractures).  If you have coughing, wheezing, and shortness of breath, those symptoms may get better when you quit. You may also get sick less often. If you are pregnant, quitting smoking can help to lower your chances of having a baby of low birth weight. What can I do to help me quit smoking? Talk with your doctor about what can help you quit smoking. Some things you can do (strategies) include:  Quitting smoking totally, instead of slowly cutting back how much you smoke over a period of time.  Going to in-person counseling. You are more likely to quit if you go to many counseling sessions.  Using resources and support systems, such as: ? Agricultural engineer with a Veterinary surgeon. ? Phone quitlines. ? Automotive engineer. ? Support groups or group counseling. ? Text messaging programs. ? Mobile phone apps or applications.  Taking medicines. Some of these medicines may have nicotine in them. If you are pregnant or breastfeeding, do not take any medicines to quit smoking unless your doctor says it is okay. Talk with your doctor about counseling or other things that can help you.  Talk with your doctor about using more than one strategy at the same time, such as taking medicines while you are also going to in-person counseling. This can help make quitting easier. What things can I do to make it easier to quit? Quitting smoking might feel very hard at first, but there is a lot that you can do to make it easier. Take these steps:  Talk to your family and friends. Ask them to support and encourage you.  Call phone quitlines, reach out to support groups, or work with a Veterinary surgeon.  Ask people who smoke to not smoke around you.  Avoid places that make you want (trigger) to smoke, such  as: ? Bars. ? Parties. ? Smoke-break areas at work.  Spend time with people who do not smoke.  Lower the stress in your life. Stress can make you want to smoke. Try these things to help your stress: ? Getting regular exercise. ? Deep-breathing exercises. ? Yoga. ? Meditating. ? Doing a body scan. To do this, close your eyes, focus on one area of your body at a time from head to toe, and notice which parts of your body are tense. Try to relax the muscles in those areas.  Download or buy apps on your mobile phone or tablet that can help you stick to your quit plan. There are many free apps, such as QuitGuide from the [employer] developer for Disease Control and Prevention). You can find more support from smokefree.gov and other websites.  This information is not intended to replace advice given to you by your health care provider. Make sure you discuss any questions you have with your health care provider. Document Released: 03/24/2009 Document Revised: 01/24/2016 Document Reviewed: 10/12/2014 Elsevier Interactive Patient Education  2018 Elsevier Inc.  Chronic Obstructive Pulmonary Disease Chronic obstructive pulmonary disease (COPD) is a long-term (chronic) lung problem. When you have COPD, it is hard for air to get in and out of your lungs. The way your lungs work will never return to normal. Usually the condition gets worse over time. There are things you can do to keep yourself as healthy as possible. Your doctor may treat your condition with:  Medicines.  Quitting smoking, if you smoke.  Rehabilitation. This may involve a team of specialists.  Oxygen.  Exercise and changes to your diet.  Lung surgery.  Comfort measures (palliative care).  Follow these instructions at home: Medicines  Take over-the-counter and prescription medicines only as told by your doctor.  Talk to your doctor before taking any cough or allergy medicines. You may need to avoid medicines that cause your lungs  to be dry. Lifestyle  If you smoke, stop. Smoking makes the problem worse. If you need help quitting, ask your doctor.  Avoid being around things that make your breathing worse. This may include smoke, chemicals, and fumes.  Stay active, but remember to also rest.  Learn and use tips on how to relax.  Make sure you get enough sleep. Most adults need at least 7 hours a night.  Eat healthy foods. Eat smaller meals more often. Rest before meals. Controlled breathing  Learn and use tips on how to control your breathing as told by your doctor. Try: ? Breathing in (inhaling) through your nose for 1 second. Then, pucker your lips and breath out (exhale) through your lips for 2 seconds. ? Putting one hand on your belly (abdomen). Breathe in slowly through your nose for 1 second. Your hand on your belly should move out. Pucker your lips and breathe out slowly through your lips. Your hand on your belly should move in as you breathe out. Controlled coughing  Learn and use controlled coughing to clear mucus from your lungs. The steps are: 1. Lean your head a little forward. 2. Breathe in deeply. 3. Try to hold your breath for 3 seconds. 4. Keep your mouth slightly open while coughing 2 times. 5. Spit any mucus out into a tissue. 6. Rest and do the steps again 1 or 2 times as needed. General instructions  Make sure you get all the shots (vaccines) that your doctor recommends. Ask your doctor about a flu shot and a pneumonia shot.  Use oxygen therapy and therapy to help improve your lungs (pulmonary rehabilitation) if told by your doctor. If you need home oxygen therapy, ask your doctor if you should buy a tool to measure your oxygen level (oximeter).  Make a COPD action plan with your doctor. This helps you know what to do if you feel worse than usual.  Manage any other conditions you have as told by your doctor.  Avoid going outside when it is very hot, cold, or humid.  Avoid people who  have a sickness  you can catch (contagious).  Keep all follow-up visits as told by your doctor. This is important. Contact a doctor if:  You cough up more mucus than usual.  There is a change in the color or thickness of the mucus.  It is harder to breathe than usual.  Your breathing is faster than usual.  You have trouble sleeping.  You need to use your medicines more often than usual.  You have trouble doing your normal activities such as getting dressed or walking around the house. Get help right away if:  You have shortness of breath while resting.  You have shortness of breath that stops you from: ? Being able to talk. ? Doing normal activities.  Your chest hurts for longer than 5 minutes.  Your skin color is more blue than usual.  Your pulse oximeter shows that you have low oxygen for longer than 5 minutes.  You have a fever.  You feel too tired to breathe normally. Summary  Chronic obstructive pulmonary disease (COPD) is a long-term lung problem.  The way your lungs work will never return to normal. Usually the condition gets worse over time. There are things you can do to keep yourself as healthy as possible.  Take over-the-counter and prescription medicines only as told by your doctor.  If you smoke, stop. Smoking makes the problem worse. This information is not intended to replace advice given to you by your health care provider. Make sure you discuss any questions you have with your health care provider. Document Released: 11/14/2007 Document Revised: 11/03/2015 Document Reviewed: 01/22/2013 Elsevier Interactive Patient Education  2017 Elsevier Inc. Pneumococcal Polysaccharide Vaccine: What You Need to Know 1. Why get vaccinated? Vaccination can protect older adults (and some children and younger adults) from pneumococcal disease. Pneumococcal disease is caused by bacteria that can spread from person to person through close contact. It can cause ear  infections, and it can also lead to more serious infections of the:  Lungs (pneumonia),  Blood (bacteremia), and  Covering of the brain and spinal cord (meningitis). Meningitis can cause deafness and brain damage, and it can be fatal.  Anyone can get pneumococcal disease, but children under 70 years of age, people with certain medical conditions, adults over 37 years of age, and cigarette smokers are at the highest risk. About 18,000 older adults die each year from pneumococcal disease in the [place]. Treatment of pneumococcal infections with penicillin and other drugs used to be more effective. But some strains of the disease have become resistant to these drugs. This makes prevention of the disease, through vaccination, even more important. 2. Pneumococcal polysaccharide vaccine (PPSV23) Pneumococcal polysaccharide vaccine (PPSV23) protects against 23 types of pneumococcal bacteria. It will not prevent all pneumococcal disease. PPSV23 is recommended for:  All adults 83 years of age and older,  Anyone 2 through 53 years of age with certain long-term health problems,  Anyone 2 through 53 years of age with a weakened immune system,  Adults 32 through 53 years of age who smoke cigarettes or have asthma.  Most people need only one dose of PPSV. A second dose is recommended for certain high-risk groups. People 39 and older should get a dose even if they have gotten one or more doses of the vaccine before they turned 65. Your healthcare provider can give you more information about these recommendations. Most healthy adults develop protection within 2 to 3 weeks of getting the shot. 3. Some people should not get this vaccine  Anyone who has had a life-threatening allergic reaction to PPSV should not get another dose.  Anyone who has a severe allergy to any component of PPSV should not receive it. Tell your provider if you have any severe allergies.  Anyone who is moderately or severely  ill when the shot is scheduled may be asked to wait until they recover before getting the vaccine. Someone with a mild illness can usually be vaccinated.  Children less than 652 years of age should not receive this vaccine.  There is no evidence that PPSV is harmful to either a pregnant woman or to her fetus. However, as a precaution, women who need the vaccine should be vaccinated before becoming pregnant, if possible. 4. Risks of a vaccine reaction With any medicine, including vaccines, there is a chance of side effects. These are usually mild and go away on their own, but serious reactions are also possible. About half of people who get PPSV have mild side effects, such as redness or pain where the shot is given, which go away within about two days. Less than 1 out of 100 people develop a fever, muscle aches, or more severe local reactions. Problems that could happen after any vaccine:  People sometimes faint after a medical procedure, including vaccination. Sitting or lying down for about 15 minutes can help prevent fainting, and injuries caused by a fall. Tell your doctor if you feel dizzy, or have vision changes or ringing in the ears.  Some people get severe pain in the shoulder and have difficulty moving the arm where a shot was given. This happens very rarely.  Any medication can cause a severe allergic reaction. Such reactions from a vaccine are very rare, estimated at about 1 in a million doses, and would happen within a few minutes to a few hours after the vaccination. As with any medicine, there is a very remote chance of a vaccine causing a serious injury or death. The safety of vaccines is always being monitored. For more information, visit: http://floyd.org/www.cdc.gov/vaccinesafety/ 5. What if there is a serious reaction? What should I look for? Look for anything that concerns you, such as signs of a severe allergic reaction, very high fever, or unusual behavior. Signs of a severe allergic  reaction can include hives, swelling of the face and throat, difficulty breathing, a fast heartbeat, dizziness, and weakness. These would usually start a few minutes to a few hours after the vaccination. What should I do? If you think it is a severe allergic reaction or other emergency that can't wait, call 9-1-1 or get to the nearest hospital. Otherwise, call your doctor. Afterward, the reaction should be reported to the Vaccine Adverse Event Reporting System (VAERS). Your doctor might file this report, or you can do it yourself through the VAERS web site at www.vaers.LAgents.nohhs.gov, or by calling 1-512-308-6088. VAERS does not give medical advice. 6. How can I learn more?  Ask your doctor. He or she can give you the vaccine package insert or suggest other sources of information.  Call your local or state health department.  Contact the Centers for Disease Control and Prevention (CDC): ? Call (812)581-57051-(609) 478-5634 (1-800-CDC-INFO) or ? Visit CDC's website at PicCapture.uywww.cdc.gov/vaccines CDC Pneumococcal Polysaccharide Vaccine VIS (10/02/13) This information is not intended to replace advice given to you by your health care provider. Make sure you discuss any questions you have with your health care provider. Document Released: 03/25/2006 Document Revised: 02/16/2016 Document Reviewed: 02/16/2016 Elsevier Interactive Patient Education  2017 [employer]
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
## 2017-01-07 NOTE — Assessment & Plan Note (Signed)
Does not appear that he had his follow up CT- ordered today.

## 2017-01-07 NOTE — Assessment & Plan Note (Signed)
Cleda DaubSpiro done today. Working on cutting down. 30+ pack year smoking history- qualifies for lung cancer screening. [facility], however due for follow up on tracheal mass and lung nodule, so will not need low dose this year.

## 2017-01-09 ENCOUNTER — Other Ambulatory Visit: Payer: Self-pay | Admitting: Family Medicine

## 2017-01-09 DIAGNOSIS — J398 Other specified diseases of upper respiratory tract: Secondary | ICD-10-CM

## 2017-01-16 ENCOUNTER — Ambulatory Visit
Admission: RE | Admit: 2017-01-16 | Discharge: 2017-01-16 | Disposition: A | Discharge status: Home / Self Care | Payer: Medicare HMO | Source: Ambulatory Visit | Attending: Family Medicine | Admitting: Family Medicine

## 2017-01-16 DIAGNOSIS — Z87891 Personal history of nicotine dependence: Secondary | ICD-10-CM | POA: Diagnosis not present

## 2017-01-16 DIAGNOSIS — J398 Other specified diseases of upper respiratory tract: Secondary | ICD-10-CM | POA: Insufficient documentation

## 2017-01-16 DIAGNOSIS — I700 Atherosclerosis of aorta: Secondary | ICD-10-CM | POA: Diagnosis not present

## 2017-01-16 DIAGNOSIS — R911 Solitary pulmonary nodule: Secondary | ICD-10-CM

## 2017-01-16 DIAGNOSIS — R918 Other nonspecific abnormal finding of lung field: Secondary | ICD-10-CM | POA: Diagnosis not present

## 2017-01-16 DIAGNOSIS — J432 Centrilobular emphysema: Secondary | ICD-10-CM | POA: Insufficient documentation

## 2017-01-16 DIAGNOSIS — R221 Localized swelling, mass and lump, neck: Secondary | ICD-10-CM | POA: Diagnosis not present

## 2017-01-16 HISTORY — DX: Unspecified asthma, uncomplicated: J45.909

## 2017-01-16 MED ORDER — IOPAMIDOL (ISOVUE-300) INJECTION 61%
75.0000 mL | Freq: Once | INTRAVENOUS | Status: AC | PRN
  Administered 2017-01-16: 75 mL via INTRAVENOUS

## 2017-01-18 ENCOUNTER — Telehealth: Payer: Self-pay | Admitting: Family Medicine

## 2017-01-18 DIAGNOSIS — R911 Solitary pulmonary nodule: Secondary | ICD-10-CM

## 2017-01-18 DIAGNOSIS — J398 Other specified diseases of upper respiratory tract: Secondary | ICD-10-CM

## 2017-01-18 NOTE — Telephone Encounter (Signed)
Please let him know that his CT scan came back good. That spot in his neck is totally gone. The little nodules in his lungs have not changed and we just need to look at them next year, but they are nothing to worry about now. Thanks!

## 2017-01-18 NOTE — Telephone Encounter (Signed)
Called patient, no answer, unable to leave a message will try again.  

## 2017-01-18 NOTE — Assessment & Plan Note (Signed)
Resolved on follow up x-ray

## 2017-01-21 ENCOUNTER — Telehealth: Payer: Self-pay | Admitting: Family Medicine

## 2017-01-21 NOTE — Telephone Encounter (Signed)
Patient had a ultrasound and would like a call from someone with the results once they are available.  Thanks  [phone]

## 2017-01-21 NOTE — Telephone Encounter (Signed)
Tried to call patient, no answer, unable to leave a message, will try again.  

## 2017-01-22 ENCOUNTER — Encounter: Payer: Self-pay | Admitting: Family Medicine

## 2017-01-22 NOTE — Telephone Encounter (Signed)
Letter generated and sent to patient with his results.

## 2017-01-22 NOTE — Telephone Encounter (Signed)
ERROR PATIENT NOT NOTIFIED.

## 2017-01-22 NOTE — Telephone Encounter (Signed)
Patient notified of results.

## 2017-01-22 NOTE — Telephone Encounter (Signed)
Routing to provider  

## 2017-01-22 NOTE — Telephone Encounter (Signed)
Patient notified

## 2017-01-22 NOTE — Telephone Encounter (Signed)
He had CT scan and we've tried calling him 3x- please see previous TE with results. Thanks!

## 2017-02-05 DIAGNOSIS — K429 Umbilical hernia without obstruction or gangrene: Secondary | ICD-10-CM

## 2017-02-07 ENCOUNTER — Ambulatory Visit: Payer: Medicare HMO | Admitting: Family Medicine

## 2017-02-14 ENCOUNTER — Ambulatory Visit: Payer: Medicare HMO | Admitting: Family Medicine

## 2017-02-18 ENCOUNTER — Encounter: Payer: Self-pay | Admitting: Family Medicine

## 2017-02-18 ENCOUNTER — Ambulatory Visit (INDEPENDENT_AMBULATORY_CARE_PROVIDER_SITE_OTHER): Payer: Medicare HMO | Admitting: Family Medicine

## 2017-02-18 VITALS — BP 145/87 | HR 68 | Wt 250.0 lb

## 2017-02-18 DIAGNOSIS — J449 Chronic obstructive pulmonary disease, unspecified: Secondary | ICD-10-CM | POA: Diagnosis not present

## 2017-02-18 MED ORDER — BUDESONIDE-FORMOTEROL FUMARATE 160-4.5 MCG/ACT IN AERO: 2.0000 | INHALATION_SPRAY | Freq: Two times a day (BID) | RESPIRATORY_TRACT | 1 refills | Status: DC

## 2017-02-18 MED ORDER — ALBUTEROL SULFATE HFA 108 (90 BASE) MCG/ACT IN AERS: 2.0000 | INHALATION_SPRAY | Freq: Four times a day (QID) | RESPIRATORY_TRACT | 2 refills | Status: DC | PRN

## 2017-02-18 NOTE — Assessment & Plan Note (Signed)
Improved on current regimen. Taking smoking cessation classes. Does not want any other medication. Call with any concerns. Recheck 6 months with spiro.

## 2017-02-18 NOTE — Progress Notes (Signed)
BP (!) 145/87 (BP Location: Left Arm, Patient Position: Sitting, Cuff Size: Large)   Pulse 68   Wt 250 lb (113.4 kg)   SpO2 99%   BMI 34.48 kg/m    Subjective:    Patient ID: Chad Gibbs, male    DOB: 02-18-1966, 51 y.o.   MRN: [id number]  HPI: Chad Gibbs is a 51 y.o. male  Chief Complaint  Patient presents with  . COPD   COPD COPD status: better Satisfied with current treatment?: yes Oxygen use: no Dyspnea frequency: couple of times a week Cough frequency: 1x a week Rescue inhaler frequency:  Not much  Limitation of activity: no Productive cough: none Last Spirometry: about a month ago Pneumovax: Up to Date Influenza: Up to Date  Relevant past medical, surgical, family and social history reviewed and updated as indicated. Interim medical history since our last visit reviewed. Allergies and medications reviewed and updated.  Review of Systems  Constitutional: Negative.   Respiratory: Negative.   Cardiovascular: Negative.   Psychiatric/Behavioral: Negative.     Per HPI unless specifically indicated above     Objective:    BP (!) 145/87 (BP Location: Left Arm, Patient Position: Sitting, Cuff Size: Large)   Pulse 68   Wt 250 lb (113.4 kg)   SpO2 99%   BMI 34.48 kg/m   Wt Readings from Last 3 Encounters:  02/18/17 250 lb (113.4 kg)  01/07/17 248 lb (112.5 kg)  01/03/17 248 lb 6.4 oz (112.7 kg)    Physical Exam  Constitutional: He is oriented to person, place, and time. He appears well-developed and well-nourished. No distress.  HENT:  Head: Normocephalic and atraumatic.  Right Ear: Hearing normal.  Left Ear: Hearing normal.  Nose: Nose normal.  Eyes: Conjunctivae and lids are normal. Right eye exhibits no discharge. Left eye exhibits no discharge. No scleral icterus.  Cardiovascular: Normal rate, regular rhythm, normal heart sounds and intact distal pulses.  Exam reveals no gallop and no friction rub.   No murmur heard. Pulmonary/Chest:  Effort normal and breath sounds normal. No respiratory distress. He has no wheezes. He has no rales. He exhibits no tenderness.  Musculoskeletal: Normal range of motion.  Neurological: He is alert and oriented to person, place, and time.  Skin: Skin is warm, dry and intact. No rash noted. He is not diaphoretic. No erythema. No pallor.  Psychiatric: He has a normal mood and affect. His speech is normal and behavior is normal. Judgment and thought content normal. Cognition and memory are normal.  Nursing note and vitals reviewed.   Results for orders placed or performed in visit on 12/18/16  Hepatitis C antibody  Result Value Ref Range   Hep C Virus Ab <0.1 0.0 - 0.9 s/co ratio  HIV antibody  Result Value Ref Range   HIV Screen 4th Generation wRfx Non Reactive Non Reactive  Bayer DCA Hb A1c Waived  Result Value Ref Range   Bayer DCA Hb A1c Waived 5.3 <7.0 %  CBC with Differential/Platelet  Result Value Ref Range   WBC 9.8 3.4 - 10.8 x10E3/uL   RBC 4.86 4.14 - 5.80 x10E6/uL   Hemoglobin 14.3 13.0 - 17.7 g/dL   Hematocrit 78.242.9 95.637.5 - 51.0 %   MCV 88 79 - 97 fL   MCH 29.4 26.6 - 33.0 pg   MCHC 33.3 31.5 - 35.7 g/dL   RDW 21.314.8 08.612.3 - 57.815.4 %   Platelets 272 150 - 379 x10E3/uL   Neutrophils 54 Not  Estab. %   Lymphs 33 Not Estab. %   Monocytes 8 Not Estab. %   Eos 4 Not Estab. %   Basos 1 Not Estab. %   Neutrophils Absolute 5.4 1.4 - 7.0 x10E3/uL   Lymphocytes Absolute 3.2 (H) 0.7 - 3.1 x10E3/uL   Monocytes Absolute 0.8 0.1 - 0.9 x10E3/uL   EOS (ABSOLUTE) 0.4 0.0 - 0.4 x10E3/uL   Basophils Absolute 0.1 0.0 - 0.2 x10E3/uL   Immature Granulocytes 0 Not Estab. %   Immature Grans (Abs) 0.0 0.0 - 0.1 x10E3/uL  Comprehensive metabolic panel  Result Value Ref Range   Glucose 73 65 - 99 mg/dL   BUN 14 6 - 24 mg/dL   Creatinine, Ser 7.82 0.76 - 1.27 mg/dL   GFR calc non Af Amer 98 >59 mL/min/1.73   GFR calc Af Amer 113 >59 mL/min/1.73   BUN/Creatinine Ratio 16 9 - 20   Sodium 143 134 -  144 mmol/L   Potassium 4.0 3.5 - 5.2 mmol/L   Chloride 101 96 - 106 mmol/L   CO2 26 20 - 29 mmol/L   Calcium 9.4 8.7 - 10.2 mg/dL   Total Protein 7.1 6.0 - 8.5 g/dL   Albumin 4.6 3.5 - 5.5 g/dL   Globulin, Total 2.5 1.5 - 4.5 g/dL   Albumin/Globulin Ratio 1.8 1.2 - 2.2   Bilirubin Total 0.4 0.0 - 1.2 mg/dL   Alkaline Phosphatase 82 39 - 117 IU/L   AST 17 0 - 40 IU/L   ALT 18 0 - 44 IU/L  Lipid Panel w/o Chol/HDL Ratio  Result Value Ref Range   Cholesterol, Total 176 100 - 199 mg/dL   Triglycerides 956 (H) 0 - 149 mg/dL   HDL 37 (L) >21 mg/dL   VLDL Cholesterol Cal 44 (H) 5 - 40 mg/dL   LDL Calculated 95 0 - 99 mg/dL  PSA  Result Value Ref Range   Prostate Specific Ag, Serum 1.1 0.0 - 4.0 ng/mL  TSH  Result Value Ref Range   TSH 2.270 0.450 - 4.500 uIU/mL  UA/M w/rflx Culture, Routine  Result Value Ref Range   Specific Gravity, UA 1.020 1.005 - 1.030   pH, UA 5.0 5.0 - 7.5   Color, UA Yellow Yellow   Appearance Ur Clear Clear   Leukocytes, UA Negative Negative   Protein, UA Negative Negative/Trace   Glucose, UA Negative Negative   Ketones, UA Negative Negative   RBC, UA Trace (A) Negative   Bilirubin, UA Negative Negative   Urobilinogen, Ur 0.2 0.2 - 1.0 mg/dL   Nitrite, UA Negative Negative      Assessment & Plan:   Problem List Items Addressed This Visit      Respiratory   COPD (chronic obstructive pulmonary disease) (HCC) - Primary    Improved on current regimen. Taking smoking cessation classes. Does not want any other medication. Call with any concerns. Recheck 6 months with spiro.      Relevant Medications   budesonide-formoterol (SYMBICORT) 160-4.5 MCG/ACT inhaler   albuterol (PROVENTIL HFA;VENTOLIN HFA) 108 (90 Base) MCG/ACT inhaler       Follow up plan: Return in about 6 months (around 08/18/2017) for Follow up breathing with spiro.

## 2017-07-04 ENCOUNTER — Other Ambulatory Visit: Payer: Self-pay | Admitting: Preventative Medicine

## 2017-07-04 DIAGNOSIS — J449 Chronic obstructive pulmonary disease, unspecified: Secondary | ICD-10-CM

## 2017-07-16 ENCOUNTER — Ambulatory Visit: Payer: Disability Insurance | Attending: Preventative Medicine

## 2017-07-16 DIAGNOSIS — J449 Chronic obstructive pulmonary disease, unspecified: Secondary | ICD-10-CM | POA: Diagnosis not present

## 2017-07-16 MED ORDER — ALBUTEROL SULFATE (2.5 MG/3ML) 0.083% IN NEBU
2.5000 mg | INHALATION_SOLUTION | Freq: Once | RESPIRATORY_TRACT | Status: AC
  Administered 2017-07-16: 2.5 mg via RESPIRATORY_TRACT
  Filled 2017-07-16: qty 3

## 2017-08-19 ENCOUNTER — Ambulatory Visit: Payer: Medicare HMO | Admitting: Family Medicine

## 2018-01-06 ENCOUNTER — Ambulatory Visit (INDEPENDENT_AMBULATORY_CARE_PROVIDER_SITE_OTHER): Payer: Medicare HMO

## 2018-01-06 VITALS — BP 124/72 | HR 74 | Temp 98.3°F | Resp 18 | Ht 73.0 in | Wt 263.1 lb

## 2018-01-06 DIAGNOSIS — Z0000 Encounter for general adult medical examination without abnormal findings: Secondary | ICD-10-CM | POA: Diagnosis not present
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
## 2018-01-06 NOTE — Patient Instructions (Addendum)
[name] , Thank you for taking time to come for your Medicare Wellness Visit. I appreciate your ongoing commitment to your health goals. Please review the following plan we discussed and let me know if I can assist you in the future.   Screening recommendations/referrals: Colonoscopy: due now  Recommended yearly ophthalmology/optometry visit for glaucoma screening and checkup Recommended yearly dental visit for hygiene and checkup  Vaccinations: Influenza vaccine: due 02/2018 Pneumococcal vaccine: due at age 109 Tdap vaccine: up to date Shingles vaccine: shingrix eligible, check with your insurance company for coverage   Advanced directives: Advance directive discussed with you today. I have provided a copy for you to complete at home and have notarized. Once this is complete please bring a copy in to our office so we can scan it into your chart.  Conditions/risks identified: Smoking cessation discussed, keep up the good work on cutting back on cigarettes.   Next appointment: Follow up with Dr.Johnson for your annual physical. Follow up in one year for your annual wellness exam.   Preventive Care 40-64 Years, Male Preventive care refers to lifestyle choices and visits with your health care provider that can promote health and wellness. What does preventive care include?  A yearly physical exam. This is also called an annual well check.  Dental exams once or twice a year.  Routine eye exams. Ask your health care provider how often you should have your eyes checked.  Personal lifestyle choices, including:  Daily care of your teeth and gums.  Regular physical activity.  Eating a healthy diet.  Avoiding tobacco and drug use.  Limiting alcohol use.  Practicing safe sex.  Taking low-dose aspirin every day starting at age 31. What happens during an annual well check? The services and screenings done by your health care provider during your annual well check will depend on your  age, overall health, lifestyle risk factors, and family history of disease. Counseling  Your health care provider may ask you questions about your:  Alcohol use.  Tobacco use.  Drug use.  Emotional well-being.  Home and relationship well-being.  Sexual activity.  Eating habits.  Work and work Astronomer. Screening  You may have the following tests or measurements:  Height, weight, and BMI.  Blood pressure.  Lipid and cholesterol levels. These may be checked every 5 years, or more frequently if you are over 84 years old.  Skin check.  Lung cancer screening. You may have this screening every year starting at age 22 if you have a 30-pack-year history of smoking and currently smoke or have quit within the past 15 years.  Fecal occult blood test (FOBT) of the stool. You may have this test every year starting at age 80.  Flexible sigmoidoscopy or colonoscopy. You may have a sigmoidoscopy every 5 years or a colonoscopy every 10 years starting at age 2.  Prostate cancer screening. Recommendations will vary depending on your family history and other risks.  Hepatitis C blood test.  Hepatitis B blood test.  Sexually transmitted disease (STD) testing.  Diabetes screening. This is done by checking your blood sugar (glucose) after you have not eaten for a while (fasting). You may have this done every 1-3 years. Discuss your test results, treatment options, and if necessary, the need for more tests with your health care provider. Vaccines  Your health care provider may recommend certain vaccines, such as:  Influenza vaccine. This is recommended every year.  Tetanus, diphtheria, and acellular pertussis (Tdap, Td) vaccine. You  may need a Td booster every 10 years.  Zoster vaccine. You may need this after age 32.  Pneumococcal 13-valent conjugate (PCV13) vaccine. You may need this if you have certain conditions and have not been vaccinated.  Pneumococcal polysaccharide  (PPSV23) vaccine. You may need one or two doses if you smoke cigarettes or if you have certain conditions. Talk to your health care provider about which screenings and vaccines you need and how often you need them. This information is not intended to replace advice given to you by your health care provider. Make sure you discuss any questions you have with your health care provider. Document Released: 06/24/2015 Document Revised: 02/15/2016 Document Reviewed: 03/29/2015 Elsevier Interactive Patient Education  2017 [place] Prevention in the Home Falls can cause injuries. They can happen to people of all ages. There are many things you can do to make your home safe and to help prevent falls. What can I do on the outside of my home?  Regularly fix the edges of walkways and driveways and fix any cracks.  Remove anything that might make you trip as you walk through a door, such as a raised step or threshold.  Trim any bushes or trees on the path to your home.  Use bright outdoor lighting.  Clear any walking paths of anything that might make someone trip, such as rocks or tools.  Regularly check to see if handrails are loose or broken. Make sure that both sides of any steps have handrails.  Any raised decks and porches should have guardrails on the edges.  Have any leaves, snow, or ice cleared regularly.  Use sand or salt on walking paths during winter.  Clean up any spills in your garage right away. This includes oil or grease spills. What can I do in the bathroom?  Use night lights.  Install grab bars by the toilet and in the tub and shower. Do not use towel bars as grab bars.  Use non-skid mats or decals in the tub or shower.  If you need to sit down in the shower, use a plastic, non-slip stool.  Keep the floor dry. Clean up any water that spills on the floor as soon as it happens.  Remove soap buildup in the tub or shower regularly.  Attach bath mats securely  with double-sided non-slip rug tape.  Do not have throw rugs and other things on the floor that can make you trip. What can I do in the bedroom?  Use night lights.  Make sure that you have a light by your bed that is easy to reach.  Do not use any sheets or blankets that are too big for your bed. They should not hang down onto the floor.  Have a firm chair that has side arms. You can use this for support while you get dressed.  Do not have throw rugs and other things on the floor that can make you trip. What can I do in the kitchen?  Clean up any spills right away.  Avoid walking on wet floors.  Keep items that you use a lot in easy-to-reach places.  If you need to reach something above you, use a strong step stool that has a grab bar.  Keep electrical cords out of the way.  Do not use floor polish or wax that makes floors slippery. If you must use wax, use non-skid floor wax.  Do not have throw rugs and other things on the floor that  can make you trip. What can I do with my stairs?  Do not leave any items on the stairs.  Make sure that there are handrails on both sides of the stairs and use them. Fix handrails that are broken or loose. Make sure that handrails are as long as the stairways.  Check any carpeting to make sure that it is firmly attached to the stairs. Fix any carpet that is loose or worn.  Avoid having throw rugs at the top or bottom of the stairs. If you do have throw rugs, attach them to the floor with carpet tape.  Make sure that you have a light switch at the top of the stairs and the bottom of the stairs. If you do not have them, ask someone to add them for you. What else can I do to help prevent falls?  Wear shoes that:  Do not have high heels.  Have rubber bottoms.  Are comfortable and fit you well.  Are closed at the toe. Do not wear sandals.  If you use a stepladder:  Make sure that it is fully opened. Do not climb a closed  stepladder.  Make sure that both sides of the stepladder are locked into place.  Ask someone to hold it for you, if possible.  Clearly mark and make sure that you can see:  Any grab bars or handrails.  First and last steps.  Where the edge of each step is.  Use tools that help you move around (mobility aids) if they are needed. These include:  Canes.  Walkers.  Scooters.  Crutches.  Turn on the lights when you go into a dark area. Replace any light bulbs as soon as they burn out.  Set up your furniture so you have a clear path. Avoid moving your furniture around.  If any of your floors are uneven, fix them.  If there are any pets around you, be aware of where they are.  Review your medicines with your doctor. Some medicines can make you feel dizzy. This can increase your chance of falling. Ask your doctor what other things that you can do to help prevent falls. This information is not intended to replace advice given to you by your health care provider. Make sure you discuss any questions you have with your health care provider. Document Released: 03/24/2009 Document Revised: 11/03/2015 Document Reviewed: 07/02/2014 Elsevier Interactive Patient Education  2017 [employer]  Your provider would like to you have your annual eye exam. Please contact your current eye doctor or here are some good options for you to contact.   [facility]        [facility] Address: [address], [place], [place] [address]   Address: [address], [place], [place] [address]  Phone: [phone]) [phone]      Phone: [phone]  Website: [email or web address]    Website: [email or web address]     [facility]       [facility]  Address: [address], [place], [place] [address]   Address: [address], [place], [place] [address] Phone: [phone]      Phone: [phone]) [phone]    [facility] Address: [address], [place], [place] [address]  Phone: [phone](336)  [phone]    Free hearing clinics offered in [place]:   [facility] [address], [place], [place] [address] [phone]  [facility] [address], [place], [place] [address] [phone] 

## 2018-02-17 ENCOUNTER — Encounter: Payer: Medicare HMO | Admitting: Family Medicine

## 2018-05-15 IMAGING — CT CT CHEST W/O CM
2 of 3 series · 15 of 36 positions shown, 18 images · non-contrast
Comparison: None.

CLINICAL DATA: Indeterminate right lung nodule.  Smoker.

EXAM:
CT CHEST WITHOUT CONTRAST
TECHNIQUE: Multidetector CT imaging of the chest was performed following the
standard protocol without IV contrast.

[Series 2: soft tissue · axial · 0.73mm/px · z∈[-559,-279]mm · 12 of 166 slices shown, 15 images]
[im 13/166  mediastinal]
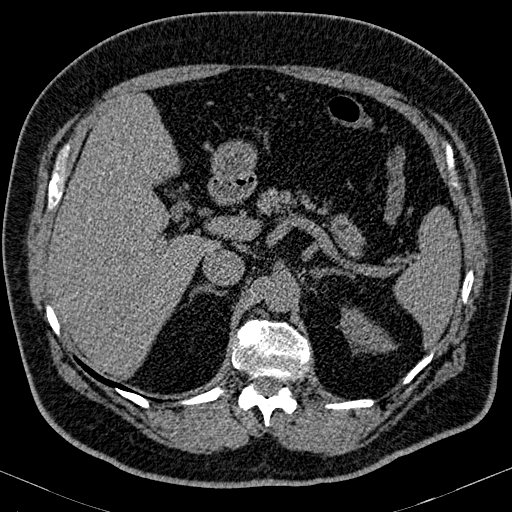
[im 13/166  lung]
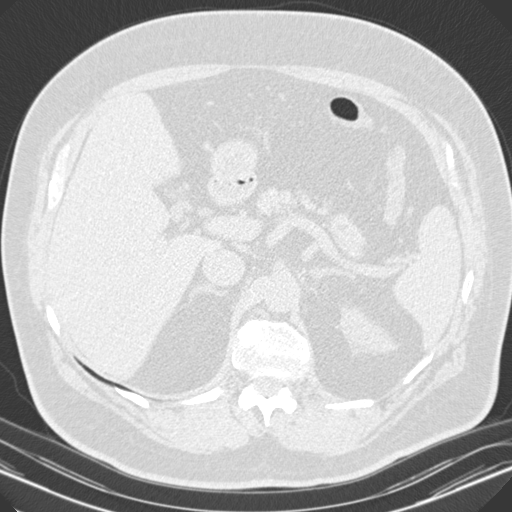
[im 25/166  lung]
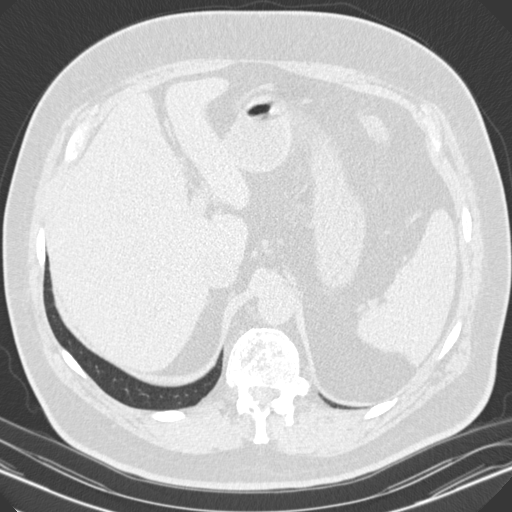
[im 37/166  lung]
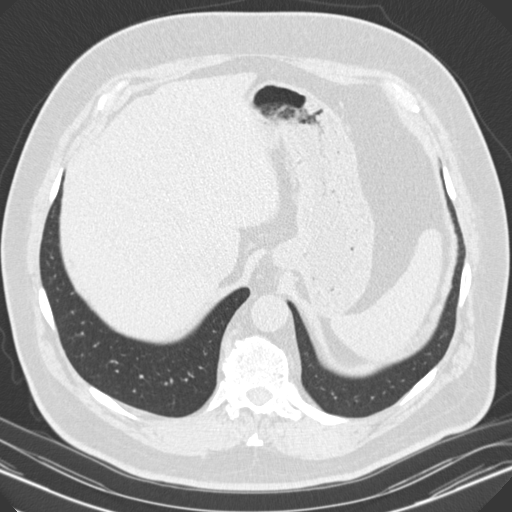
[im 49/166  lung]
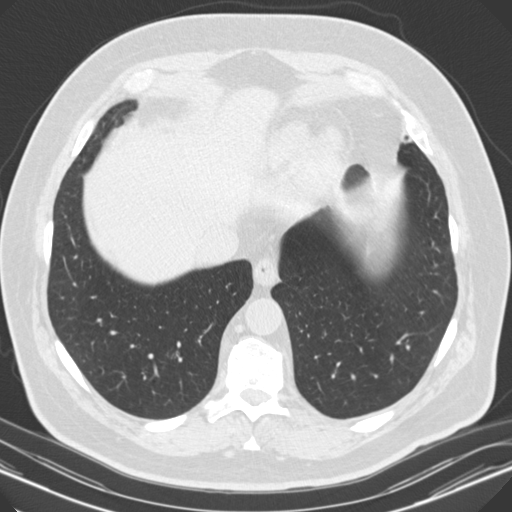
[im 62/166  mediastinal]
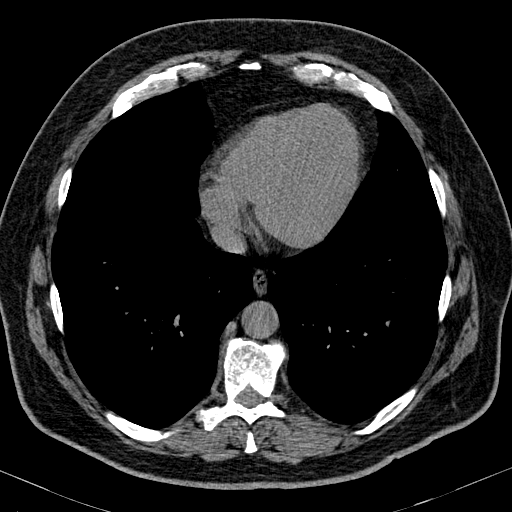
[im 62/166  lung]
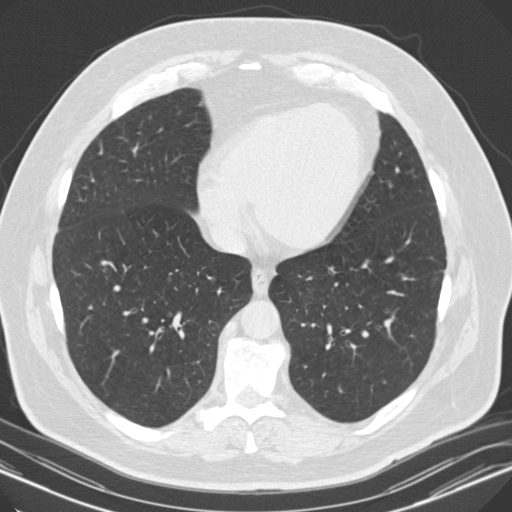
[im 74/166  lung]
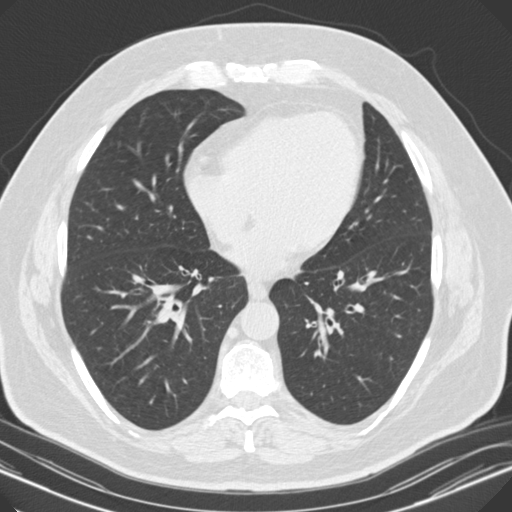
[im 92/166  lung]
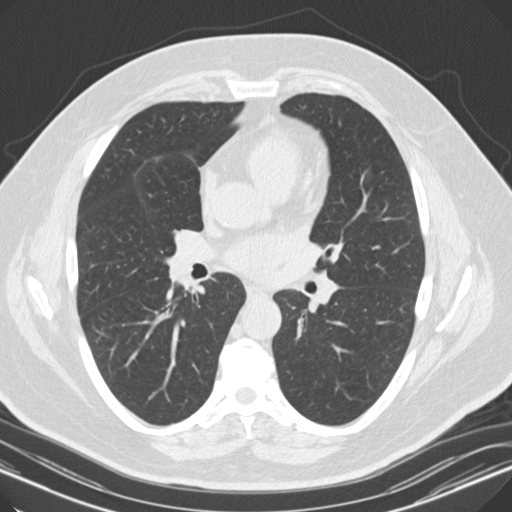
[im 104/166  lung]
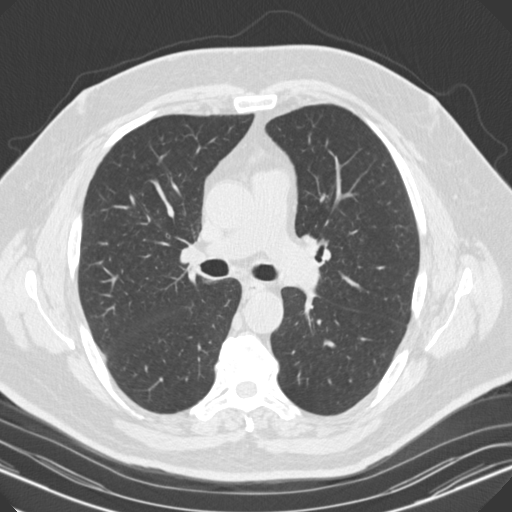
[im 117/166  mediastinal]
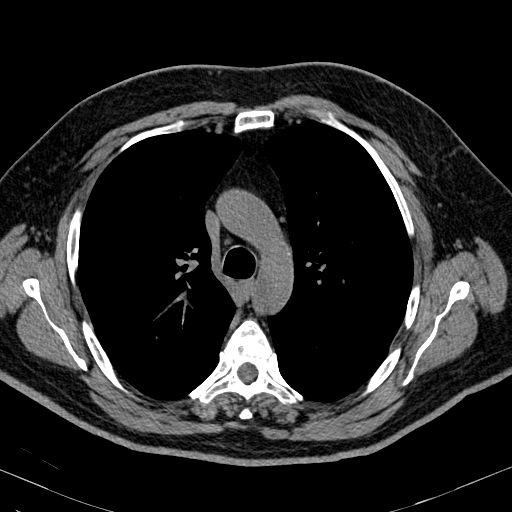
[im 117/166  lung]
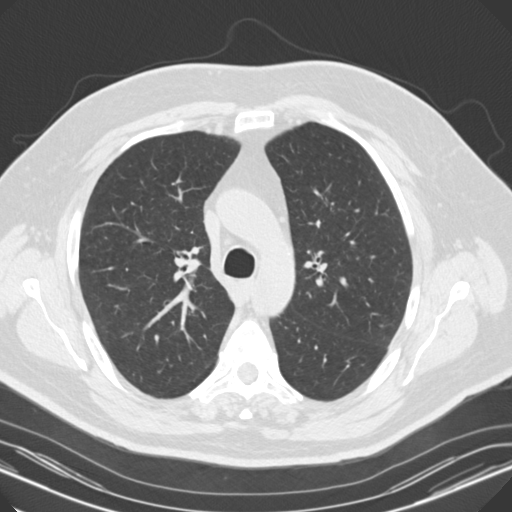
[im 129/166  lung]
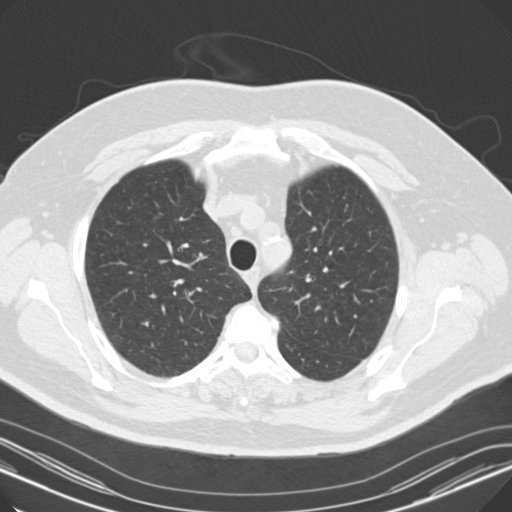
[im 141/166  lung]
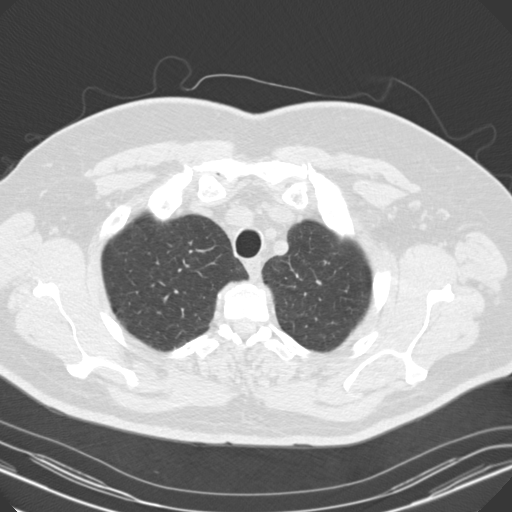
[im 153/166  lung]
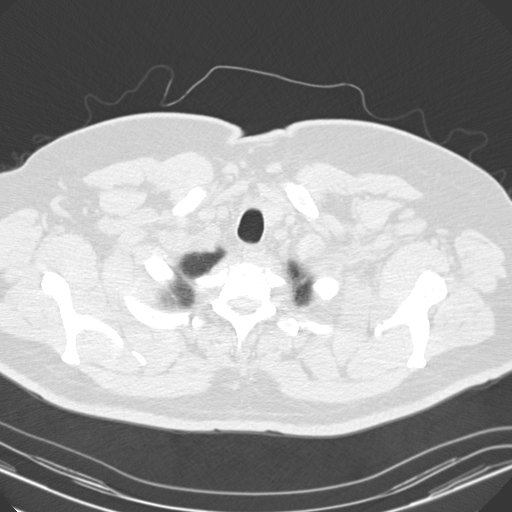

[Series 602: coronal · coronal · 0.73mm/px · 3 of 182 slices shown]
[im 37/182  lung]
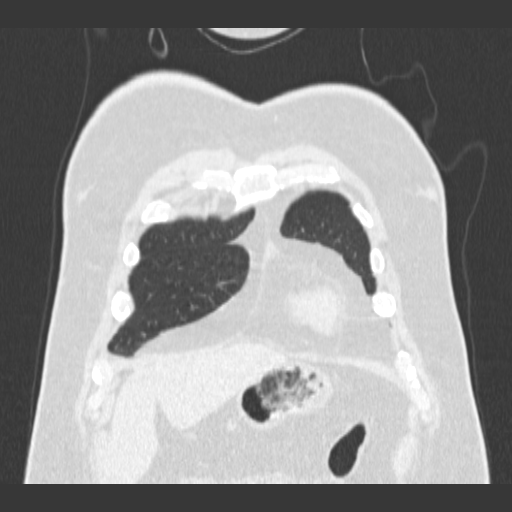
[im 73/182  lung]
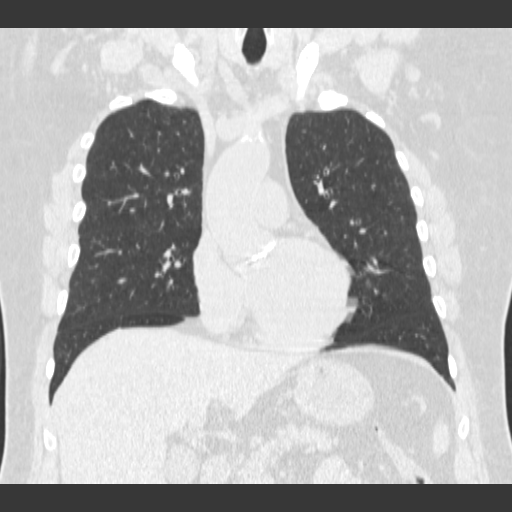
[im 109/182  lung]
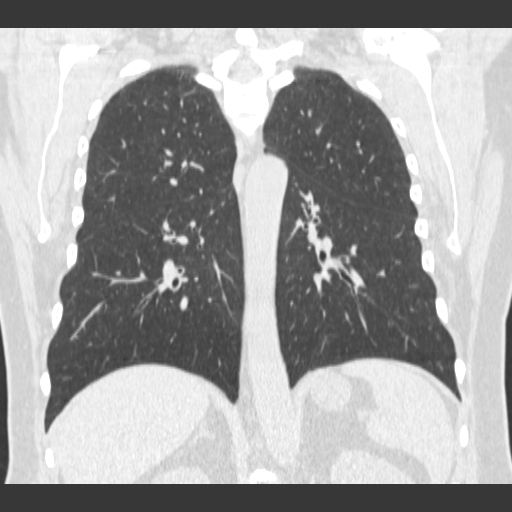

[15 of 36 positions shown; findings below may reference images not displayed]

FINDINGS: Cardiovascular: No acute findings. Aortic and coronary artery
atherosclerosis.

Mediastinum/Nodes: No masses or pathologically enlarged lymph nodes
identified on this unenhanced exam.

Lungs/Pleura: A small triangular nodular density is seen along the
minor fissure in the right lung, consistent with an intrapulmonary
lymph node. There are other scattered sub-cm pulmonary nodules seen
bilaterally, largest in the left upper lobe measuring 4 mm on image
67/3. These are indeterminate. No evidence of pulmonary infiltrate
or pleural effusion. Mild centrilobular emphysema noted.

Upper Abdomen:  Unremarkable.

Musculoskeletal:  No suspicious bone lesions.
IMPRESSION: Tiny indeterminate bilateral pulmonary nodules measuring up to 4 mm.
Non-contrast chest CT showed be considered in 12 months given
patient's history of smoking. This recommendation follows the
consensus statement: Guidelines for Management of Incidental
Pulmonary Nodules Detected on CT Images: From the [HOSPITAL]

Aortic Atherosclerosis (3JN0D-5JV.V) and Emphysema (3JN0D-ZE9.2).

## 2018-08-24 NOTE — Progress Notes (Addendum)
BP 131/74   Pulse 96   Temp 97.8 F (36.6 C) (Oral)   Wt 272 lb 3.2 oz (123.5 kg)   SpO2 95%   BMI 35.91 kg/m    Subjective:    Patient ID: Chad Gibbs, male    DOB: January 13, 1966, 53 y.o.   MRN: [id number]  HPI: Chad Gibbs is a 53 y.o. male  Chief Complaint  Patient presents with  . Hernia  . Insomnia    pt states he has not been able to sleep for thr past 3 to 4 weeks    HERNIA Duration: chronic Location:  belly button Painful: yes- couple of months Discomfort: yes Bulge: yes Quality:  "pain" Onset: comes and goes Severity: severe Context: bigger Aggravating factors: lifting, coughing and sneezing   COPD- has been out of his inhalers due to cost for about a year COPD status: uncontrolled Satisfied with current treatment?: no Oxygen use: no Dyspnea frequency: all the time Cough frequency: several times a day Rescue inhaler frequency:  Never- doesn't have it Limitation of activity: yes Pneumovax: Up to Date Influenza: Not up to Date   Has been having trouble sleeping because he can't breathe at night. Has been off his medicine.   Relevant past medical, surgical, family and social history reviewed and updated as indicated. Interim medical history since our last visit reviewed. Allergies and medications reviewed and updated.  Review of Systems  Constitutional: Negative.   HENT: Negative.   Respiratory: Positive for cough, chest tightness, shortness of breath and wheezing. Negative for apnea, choking and stridor.   Cardiovascular: Negative.   Gastrointestinal: Positive for abdominal distention and abdominal pain. Negative for anal bleeding, blood in stool, constipation, diarrhea, nausea, rectal pain and vomiting.  Musculoskeletal: Negative.   Skin: Negative.   Neurological: Negative.   Psychiatric/Behavioral: Negative.     Per HPI unless specifically indicated above     Objective:    BP 131/74   Pulse 96   Temp 97.8 F (36.6 C) (Oral)   Wt  272 lb 3.2 oz (123.5 kg)   SpO2 95%   BMI 35.91 kg/m   Wt Readings from Last 3 Encounters:  08/25/18 272 lb 3.2 oz (123.5 kg)  01/06/18 263 lb 1.6 oz (119.3 kg)  02/18/17 250 lb (113.4 kg)    Physical Exam Vitals signs and nursing note reviewed.  Constitutional:      General: He is not in acute distress.    Appearance: Normal appearance. He is not ill-appearing, toxic-appearing or diaphoretic.  HENT:     Head: Normocephalic and atraumatic.     Right Ear: External ear normal.     Left Ear: External ear normal.     Nose: Nose normal.     Mouth/Throat:     Mouth: Mucous membranes are moist.     Pharynx: Oropharynx is clear.  Eyes:     General: No scleral icterus.       Right eye: No discharge.        Left eye: No discharge.     Extraocular Movements: Extraocular movements intact.     Conjunctiva/sclera: Conjunctivae normal.     Pupils: Pupils are equal, round, and reactive to light.  Neck:     Musculoskeletal: Normal range of motion and neck supple.  Cardiovascular:     Rate and Rhythm: Normal rate and regular rhythm.     Pulses: Normal pulses.     Heart sounds: Normal heart sounds. No murmur. No friction rub. No  gallop.   Pulmonary:     Effort: Pulmonary effort is normal. No respiratory distress.     Breath sounds: Normal breath sounds. No stridor. No wheezing, rhonchi or rales.  Chest:     Chest wall: No tenderness.  Abdominal:     General: Abdomen is flat. Bowel sounds are normal. There is no distension.     Palpations: Abdomen is soft. There is no mass.     Tenderness: There is abdominal tenderness (on hernia). There is no right CVA tenderness, left CVA tenderness, guarding or rebound.     Hernia: A hernia (umbilical- about 4.5-5 inches, not red or hot, not reducible) is present.  Musculoskeletal: Normal range of motion.  Skin:    General: Skin is warm and dry.     Capillary Refill: Capillary refill takes less than 2 seconds.     Coloration: Skin is not jaundiced or  pale.     Findings: No bruising, erythema, lesion or rash.  Neurological:     General: No focal deficit present.     Mental Status: He is alert and oriented to person, place, and time. Mental status is at baseline.  Psychiatric:        Mood and Affect: Mood normal.        Behavior: Behavior normal.        Thought Content: Thought content normal.        Judgment: Judgment normal.     Results for orders placed or performed in visit on 08/25/18  Bayer DCA Hb A1c Waived  Result Value Ref Range   HB A1C (BAYER DCA - WAIVED) 5.4 <7.0 %  Microalbumin, Urine Waived  Result Value Ref Range   Microalb, Ur Waived 80 (H) 0 - 19 mg/L   Creatinine, Urine Waived 200 10 - 300 mg/dL   Microalb/Creat Ratio <30 <30 mg/g  UA/M w/rflx Culture, Routine  Result Value Ref Range   Specific Gravity, UA 1.020 1.005 - 1.030   pH, UA 6.0 5.0 - 7.5   Color, UA Negative Yellow   Appearance Ur Negative Clear   Leukocytes, UA Negative Negative   Protein, UA Negative Negative/Trace   Glucose, UA Negative Negative   Ketones, UA Negative Negative   RBC, UA Negative Negative   Bilirubin, UA Negative Negative   Urobilinogen, Ur 2.0 (H) 0.2 - 1.0 mg/dL   Nitrite, UA Negative Negative      Assessment & Plan:   Problem List Items Addressed This Visit      Respiratory   COPD (chronic obstructive pulmonary disease) (HCC)    Not under good control. Cannot afford his medicine. Will restart his symbicort- samples given today. Will get him in touch with the care management team. Call with any concerns.       Relevant Medications   budesonide-formoterol (SYMBICORT) 160-4.5 MCG/ACT inhaler   albuterol (PROVENTIL HFA;VENTOLIN HFA) 108 (90 Base) MCG/ACT inhaler   Other Relevant Orders   CBC with Differential/Platelet   Comprehensive metabolic panel   Microalbumin, Urine Waived (Completed)   TSH   UA/M w/rflx Culture, Routine (Completed)   Referral to Chronic Care Management Services     Nervous and Auditory    TBI (traumatic brain injury) (HCC)    Has help at home. Continue to monitor.         Other   History of alcoholism (HCC)    Labs drawn today. Await results.       Relevant Orders   CBC with Differential/Platelet   Comprehensive  metabolic panel   Microalbumin, Urine Waived (Completed)   TSH   UA/M w/rflx Culture, Routine (Completed)   Class 2 obesity due to excess calories without serious comorbidity with body mass index (BMI) of 35.0 to 35.9 in adult    Discussed importance of diet and exercise with goal of losing 1-2lbs per week. Recheck 1 month.       Relevant Orders   Bayer DCA Hb A1c Waived (Completed)   CBC with Differential/Platelet   Comprehensive metabolic panel   Microalbumin, Urine Waived (Completed)   TSH   UA/M w/rflx Culture, Routine (Completed)   Umbilical hernia without obstruction and without gangrene - Primary    Getting bigger. Will get him into see general surgery- referral generated today. Call with any concerns.       Relevant Orders   CBC with Differential/Platelet   Comprehensive metabolic panel   Microalbumin, Urine Waived (Completed)   TSH   UA/M w/rflx Culture, Routine (Completed)   Ambulatory referral to General Surgery    Other Visit Diagnoses    Screening cholesterol level       Labs drawn today. Await results.    Relevant Orders   Lipid Panel w/o Chol/HDL Ratio   Microalbumin, Urine Waived (Completed)   TSH   UA/M w/rflx Culture, Routine (Completed)   Screening for prostate cancer       Labs drawn today. Await results.    Relevant Orders   PSA       Follow up plan: Return in about 4 weeks (around 09/22/2018) for follow up sleeping/COPD.

## 2018-08-25 ENCOUNTER — Other Ambulatory Visit: Payer: Self-pay

## 2018-08-25 ENCOUNTER — Encounter: Payer: Self-pay | Admitting: Family Medicine

## 2018-08-25 ENCOUNTER — Ambulatory Visit (INDEPENDENT_AMBULATORY_CARE_PROVIDER_SITE_OTHER): Payer: Medicare HMO | Admitting: Family Medicine

## 2018-08-25 VITALS — BP 131/74 | HR 96 | Temp 97.8°F | Wt 272.2 lb

## 2018-08-25 DIAGNOSIS — F1021 Alcohol dependence, in remission: Secondary | ICD-10-CM

## 2018-08-25 DIAGNOSIS — J449 Chronic obstructive pulmonary disease, unspecified: Secondary | ICD-10-CM

## 2018-08-25 DIAGNOSIS — Z125 Encounter for screening for malignant neoplasm of prostate: Secondary | ICD-10-CM

## 2018-08-25 DIAGNOSIS — Z13220 Encounter for screening for lipoid disorders: Secondary | ICD-10-CM

## 2018-08-25 DIAGNOSIS — K429 Umbilical hernia without obstruction or gangrene: Secondary | ICD-10-CM | POA: Diagnosis not present

## 2018-08-25 DIAGNOSIS — E6609 Other obesity due to excess calories: Secondary | ICD-10-CM | POA: Diagnosis not present

## 2018-08-25 DIAGNOSIS — R69 Illness, unspecified: Secondary | ICD-10-CM | POA: Diagnosis not present

## 2018-08-25 DIAGNOSIS — S069X9A Unspecified intracranial injury with loss of consciousness of unspecified duration, initial encounter: Secondary | ICD-10-CM

## 2018-08-25 DIAGNOSIS — Z1329 Encounter for screening for other suspected endocrine disorder: Secondary | ICD-10-CM | POA: Diagnosis not present

## 2018-08-25 DIAGNOSIS — Z6835 Body mass index (BMI) 35.0-35.9, adult: Secondary | ICD-10-CM | POA: Diagnosis not present

## 2018-08-25 LAB — UA/M W/RFLX CULTURE, ROUTINE
Appearance Ur: NEGATIVE
Bilirubin, UA: NEGATIVE
Color, UA: NEGATIVE
Glucose, UA: NEGATIVE
Ketones, UA: NEGATIVE
LEUKOCYTES UA: NEGATIVE
Nitrite, UA: NEGATIVE
PH UA: 6 (ref 5.0–7.5)
PROTEIN UA: NEGATIVE
RBC UA: NEGATIVE
SPEC GRAV UA: 1.02 (ref 1.005–1.030)
Urobilinogen, Ur: 2 mg/dL — ABNORMAL HIGH (ref 0.2–1.0)

## 2018-08-25 LAB — MICROALBUMIN, URINE WAIVED
Creatinine, Urine Waived: 200 mg/dL (ref 10–300)
MICROALB, UR WAIVED: 80 mg/L — AB (ref 0–19)

## 2018-08-25 LAB — BAYER DCA HB A1C WAIVED: HB A1C: 5.4 % (ref <7.0)

## 2018-08-25 MED ORDER — BUDESONIDE-FORMOTEROL FUMARATE 160-4.5 MCG/ACT IN AERO: 2.0000 | INHALATION_SPRAY | Freq: Two times a day (BID) | RESPIRATORY_TRACT | 1 refills | Status: DC

## 2018-08-25 MED ORDER — ALBUTEROL SULFATE HFA 108 (90 BASE) MCG/ACT IN AERS: 2.0000 | INHALATION_SPRAY | Freq: Four times a day (QID) | RESPIRATORY_TRACT | 2 refills | Status: DC | PRN

## 2018-08-25 NOTE — Assessment & Plan Note (Signed)
Not under good control. Cannot afford his medicine. Will restart his symbicort- samples given today. Will get him in touch with the care management team. Call with any concerns.

## 2018-08-25 NOTE — Assessment & Plan Note (Signed)
Has help at home. Continue to monitor.

## 2018-08-25 NOTE — Patient Instructions (Signed)
Thursday 3/19 @ 10:30 Dr. Maia Plan at [facility]- General surgery arrive 15 min before appt.

## 2018-08-25 NOTE — Assessment & Plan Note (Signed)
Getting bigger. Will get him into see general surgery- referral generated today. Call with any concerns.

## 2018-08-25 NOTE — Assessment & Plan Note (Signed)
Discussed importance of diet and exercise with goal of losing 1-2lbs per week. Recheck 1 month.

## 2018-08-25 NOTE — Assessment & Plan Note (Signed)
Labs drawn today. Await results.  

## 2018-08-26 ENCOUNTER — Ambulatory Visit: Payer: Self-pay

## 2018-08-26 ENCOUNTER — Ambulatory Visit (INDEPENDENT_AMBULATORY_CARE_PROVIDER_SITE_OTHER): Payer: Medicare HMO | Admitting: Pharmacist

## 2018-08-26 DIAGNOSIS — J449 Chronic obstructive pulmonary disease, unspecified: Secondary | ICD-10-CM

## 2018-08-26 LAB — CBC WITH DIFFERENTIAL/PLATELET
BASOS: 1 %
Basophils Absolute: 0.1 10*3/uL (ref 0.0–0.2)
EOS (ABSOLUTE): 0.3 10*3/uL (ref 0.0–0.4)
EOS: 3 %
HEMATOCRIT: 39.6 % (ref 37.5–51.0)
HEMOGLOBIN: 13.2 g/dL (ref 13.0–17.7)
IMMATURE GRANULOCYTES: 0 %
Immature Grans (Abs): 0 10*3/uL (ref 0.0–0.1)
LYMPHS ABS: 2.5 10*3/uL (ref 0.7–3.1)
Lymphs: 26 %
MCH: 28.8 pg (ref 26.6–33.0)
MCHC: 33.3 g/dL (ref 31.5–35.7)
MCV: 87 fL (ref 79–97)
MONOCYTES: 8 %
MONOS ABS: 0.8 10*3/uL (ref 0.1–0.9)
NEUTROS PCT: 62 %
Neutrophils Absolute: 6.2 10*3/uL (ref 1.4–7.0)
Platelets: 288 10*3/uL (ref 150–450)
RBC: 4.58 x10E6/uL (ref 4.14–5.80)
RDW: 13.2 % (ref 11.6–15.4)
WBC: 9.8 10*3/uL (ref 3.4–10.8)

## 2018-08-26 LAB — LIPID PANEL W/O CHOL/HDL RATIO
CHOLESTEROL TOTAL: 201 mg/dL — AB (ref 100–199)
HDL: 44 mg/dL (ref >39)
LDL Calculated: 139 mg/dL — ABNORMAL HIGH (ref 0–99)
Triglycerides: 88 mg/dL (ref 0–149)
VLDL CHOLESTEROL CAL: 18 mg/dL (ref 5–40)

## 2018-08-26 LAB — COMPREHENSIVE METABOLIC PANEL
A/G RATIO: 1.4 (ref 1.2–2.2)
ALBUMIN: 4.2 g/dL (ref 3.8–4.9)
ALK PHOS: 88 IU/L (ref 39–117)
ALT: 20 IU/L (ref 0–44)
AST: 14 IU/L (ref 0–40)
BUN/Creatinine Ratio: 11 (ref 9–20)
BUN: 11 mg/dL (ref 6–24)
Bilirubin Total: 0.8 mg/dL (ref 0.0–1.2)
CALCIUM: 9.3 mg/dL (ref 8.7–10.2)
CHLORIDE: 100 mmol/L (ref 96–106)
CO2: 26 mmol/L (ref 20–29)
Creatinine, Ser: 0.97 mg/dL (ref 0.76–1.27)
GFR calc Af Amer: 101 mL/min/{1.73_m2} (ref >59)
GFR calc non Af Amer: 88 mL/min/{1.73_m2} (ref >59)
GLOBULIN, TOTAL: 2.9 g/dL (ref 1.5–4.5)
Glucose: 81 mg/dL (ref 65–99)
Potassium: 4 mmol/L (ref 3.5–5.2)
Sodium: 143 mmol/L (ref 134–144)
Total Protein: 7.1 g/dL (ref 6.0–8.5)

## 2018-08-26 LAB — TSH: TSH: 2.78 u[IU]/mL (ref 0.450–4.500)

## 2018-08-26 LAB — PSA: Prostate Specific Ag, Serum: 1.1 ng/mL (ref 0.0–4.0)

## 2018-08-26 MED ORDER — ALBUTEROL SULFATE HFA 108 (90 BASE) MCG/ACT IN AERS: 2.0000 | INHALATION_SPRAY | Freq: Four times a day (QID) | RESPIRATORY_TRACT | 2 refills | Status: DC | PRN

## 2018-08-26 MED ORDER — MOMETASONE FURO-FORMOTEROL FUM 200-5 MCG/ACT IN AERO: 2.0000 | INHALATION_SPRAY | Freq: Two times a day (BID) | RESPIRATORY_TRACT | 6 refills | Status: DC

## 2018-08-26 NOTE — Patient Instructions (Signed)
Visit Information  Goals Addressed            This Visit's Progress     Patient Stated   . "I can't afford my inhalers" (pt-stated)       Current Barriers:  . Financial Barriers- patient notes that he cannot afford Symbicort or albuterol inhalers; he notes confusion regarding if he has active prescription coverage  Pharmacist Clinical Goal(s):  [name] Over the next 30 days, patient will work with PharmD to address needs related to inhaler medication access  Interventions: . Comprehensive medication review performed.  Kandice Robinsons [employer] prescription help desk- they confirmed that patient does have an active [place] plan, same information as the scanned card from 2018. This plan has a significant prescription deductible, so Symbicort therapy would be $297 on the first fill, but then $47/30 days thereafter. Patient confirms that he cannot afford this . Symbicort patient assistance [employer]) would require that patient spend 3% of yearly household income on prescription medications before qualifying. However, Dulera and Proventil (Merck) prescription assistance does NOT have an out of pocket requirement  Patient Self Care Activities:  . Currently UNABLE TO independently manage medications   Plan: . PharmD will discuss switch to [facility] and Proventil with PCP Dr. Wynetta Emery, and if amenable, will begin process for patient assistance application.  Initial goal documentation        Chad Gibbs was given information about Chronic Care Management services today including:  1. CCM service includes personalized support from designated clinical staff supervised by his physician, including individualized plan of care and coordination with other care providers 2. 24/7 contact phone numbers for assistance for urgent and routine care needs. 3. Service will only be billed when office clinical staff spend 20 minutes or more in a month to coordinate care. 4. Only one practitioner may furnish and bill the  service in a calendar month. 5. The patient may stop CCM services at any time (effective at the end of the month) by phone call to the office staff. 6. The patient will be responsible for cost sharing (co-pay) of up to 20% of the service fee (after annual deductible is met).  Patient agreed to services and verbal consent obtained.   The patient verbalized understanding of instructions provided today and declined a print copy of patient instruction materials.   Plan - Will discuss switch to Dulera/Proventil with Dr. Wynetta Emery and start patient assistance process.   Catie Darnelle Maffucci, PharmD Clinical Pharmacist [place] [phone]

## 2018-08-26 NOTE — Chronic Care Management (AMB) (Signed)
Chronic Care Management   Note  08/26/2018 Name: Chad Gibbs MRN: [id number] DOB: October 07, 1965   Subjective:  Patient is a 53 year old patient seen by Park Liter, DO for primary care services, referred to chronic care management program for assistance with COPD, hx TBI, tobacco abuse, GAD, specifically for assistance with medication affordability.  Contacted patient today.  Chad Gibbs was given information about Chronic Care Management services today including:  1. CCM service includes personalized support from designated clinical staff supervised by his physician, including individualized plan of care and coordination with other care providers 2. 24/7 contact phone numbers for assistance for urgent and routine care needs. 3. Service will only be billed when office clinical staff spend 20 minutes or more in a month to coordinate care. 4. Only one practitioner may furnish and bill the service in a calendar month. 5. The patient may stop CCM services at any time (effective at the end of the month) by phone call to the office staff. 6. The patient will be responsible for cost sharing (co-pay) of up to 20% of the service fee (after annual deductible is met).  Patient agreed to services and verbal consent obtained.   Review of patient status, including review of consultants reports, laboratory and other test data, was performed as part of comprehensive evaluation and provision of chronic care management services.   Objective: Lab Results  Component Value Date   CREATININE 0.97 08/25/2018   CREATININE 0.88 12/18/2016   CREATININE 0.77 03/05/2011    Lab Results  Component Value Date   HGBA1C 5.4 08/25/2018    Lipid Panel     Component Value Date/Time   CHOL 201 (H) 08/25/2018 1101   TRIG 88 08/25/2018 1101   HDL 44 08/25/2018 1101   LDLCALC 139 (H) 08/25/2018 1101    BP Readings from Last 3 Encounters:  08/25/18 131/74  01/06/18 124/72  02/18/17 (!) 145/87     Allergies  Allergen Reactions  . Morphine And Related Nausea And Vomiting    Only when mixed with oxycontin  . Oxycodone Hcl Nausea And Vomiting    Only when mixed with morphine    Medications Reviewed Today    Reviewed by Valerie Roys, DO (Physician) on 08/25/18 at 1124  Med List Status: <None>  Medication Order Taking? Sig Documenting Provider Last Dose Status Informant  albuterol (PROVENTIL HFA;VENTOLIN HFA) 108 (90 Base) MCG/ACT inhaler [id number]  Inhale 2 puffs into the lungs every 6 (six) hours as needed for wheezing or shortness of breath. Park Liter P, DO  Active   budesonide-formoterol (SYMBICORT) 160-4.5 MCG/ACT inhaler [id number]  Inhale 2 puffs into the lungs 2 (two) times daily. Valerie Roys, DO  Active          Assessment:  Goals Addressed            This Visit's Progress     Patient Stated   . "I can't afford my inhalers" (pt-stated)       Current Barriers:  . Financial Barriers- patient notes that he cannot afford Symbicort or albuterol inhalers; he notes confusion regarding if he has active prescription coverage  Pharmacist Clinical Goal(s):  [name] Over the next 30 days, patient will work with PharmD to address needs related to inhaler medication access  Interventions: . Comprehensive medication review performed.  Kandice Robinsons [employer] prescription help desk- they confirmed that patient does have an active [place] plan, same information as the scanned card from 2018. This plan has a significant  prescription deductible, so Symbicort therapy would be $297 on the first fill, but then $47/30 days thereafter. Patient confirms that he cannot afford this . Symbicort patient assistance Technical brewer) would require that patient spend 3% of yearly household income on prescription medications before qualifying. However, Dulera and Proventil (Merck) prescription assistance does NOT have an out of pocket requirement  Patient Self Care Activities:  . Currently UNABLE TO  independently manage medications   Plan: . PharmD will discuss switch to [facility] and Proventil with PCP Dr. Wynetta Emery, and if amenable, will begin process for patient assistance application.  Initial goal documentation       Plan - Will discuss switch to Dulera/Proventil with Dr. Wynetta Emery and start patient assistance process.   Catie Darnelle Maffucci, PharmD Clinical Pharmacist [place] [phone]

## 2018-08-26 NOTE — Addendum Note (Signed)
Addended by: Dorcas Carrow on: 08/26/2018 06:14 PM   Modules accepted: Orders

## 2018-08-26 NOTE — Chronic Care Management (AMB) (Signed)
  Chronic Care Management   Note  08/26/2018 Name: Chad Gibbs MRN: [id number] DOB: 1966/03/21  Patient is a 53 year old male followed by Olevia Perches, DO for primary care services, referred to chronic care management for support with COPD, hx TBI, GAD, hx tobacco abuse, specifically for assistance in obtaining inhaler therapy.   Contacted patient; left HIPAA compliant message for him to return my call at his convenience.    Follow up plan: - If I have not heard back from patient, will outreach again within 5-7 days  Catie Feliz Beam, PharmD Clinical Pharmacist [facility] [phone]

## 2018-08-27 ENCOUNTER — Ambulatory Visit: Payer: Self-pay | Admitting: Pharmacist

## 2018-08-27 DIAGNOSIS — J449 Chronic obstructive pulmonary disease, unspecified: Secondary | ICD-10-CM

## 2018-08-27 NOTE — Chronic Care Management (AMB) (Signed)
  Chronic Care Management   Follow Up Note   08/27/2018 Name: Chad Gibbs MRN: [id number] DOB: [birth year]  Referred by: Dorcas Carrow, DO Reason for referral : Chronic Care Management (Medication Management)   Chad Gibbs is a 53 y.o. year old male who is a primary care patient of Dorcas Carrow, DO. The CCM team was consulted for assistance with chronic disease management and care coordination needs.    Contacted patient, left HIPAA compliant message for him to return my call at his convenience.  Review of patient status, including review of consultants reports, relevant laboratory and other test results, and collaboration with appropriate care team members and the patient's provider was performed as part of comprehensive patient evaluation and provision of chronic care management services.    Goals Addressed            This Visit's Progress     Patient Stated   . "I can't afford my inhalers" (pt-stated)       Current Barriers:  . Financial Barriers- patient notes that he cannot afford Symbicort or albuterol inhalers; he notes confusion regarding if he has active prescription coverage . Patient may qualify for [employer] patient assistance  Pharmacist Clinical Goal(s):  [name] Over the next 30 days, patient will work with PharmD to address needs related to inhaler medication access  Interventions: . Comprehensive medication review performed.  . Started process for [employer] patient assistance for [employer]. Provider portion signed by Dr. Laural Benes . Contacted patient; left message for him to return my call. Will ask him to bring financial information (proof of income) to clinic, as well as sign the patient portion of the application that is at the front desk of clinic  Patient Self Care Activities:  . Currently UNABLE TO independently manage medications   Plan: . PharmD will f/u with patient within a week if I have not heard back from him regarding financial information needed   Please see past updates related to this goal by clicking on the "Past Updates" button in the selected goal          Plan - Will follow up with patient in 5-7 days if I have not heard back from him  Catie Feliz Beam, PharmD Clinical Pharmacist [facility] [phone]

## 2018-08-27 NOTE — Patient Instructions (Signed)
Visit Information  Goals Addressed            This Visit's Progress     Patient Stated   . "I can't afford my inhalers" (pt-stated)       Current Barriers:  . Financial Barriers- patient notes that he cannot afford Symbicort or albuterol inhalers; he notes confusion regarding if he has active prescription coverage . Patient may qualify for [employer] patient assistance  Pharmacist Clinical Goal(s):  [name] Over the next 30 days, patient will work with PharmD to address needs related to inhaler medication access  Interventions: . Comprehensive medication review performed.  . Started process for [employer] patient assistance for [employer]. Provider portion signed by Dr. Laural Benes . Contacted patient; left message for him to return my call. Will ask him to bring financial information (proof of income) to clinic, as well as sign the patient portion of the application that is at the front desk of clinic  Patient Self Care Activities:  . Currently UNABLE TO independently manage medications   Plan: . PharmD will f/u with patient within a week if I have not heard back from him regarding financial information needed  Please see past updates related to this goal by clicking on the "Past Updates" button in the selected goal         The patient verbalized understanding of instructions provided today and declined a print copy of patient instruction materials.   Plan - Will follow up with patient in 5-7 days if I have not heard back from him.   Catie Feliz Beam, PharmD Clinical Pharmacist [facility] [phone]

## 2018-08-28 ENCOUNTER — Encounter: Payer: Self-pay | Admitting: Family Medicine

## 2018-09-03 ENCOUNTER — Ambulatory Visit: Payer: Self-pay | Admitting: Pharmacist

## 2018-09-03 DIAGNOSIS — J449 Chronic obstructive pulmonary disease, unspecified: Secondary | ICD-10-CM

## 2018-09-03 NOTE — Patient Instructions (Signed)
Visit Information  Goals Addressed            This Visit's Progress     Patient Stated   . "I can't afford my inhalers" (pt-stated)       Current Barriers:  . Financial Barriers- patient notes that he cannot afford Symbicort or albuterol inhalers;  . Patient eligible for [employer] patient assistance for [employer] and Proventil  Pharmacist Clinical Goal(s):  [name] Over the next 30 days, patient will work with PharmD to address needs related to inhaler medication access  Interventions:  Patient provided signature and financial paperwork. Worked with office staff to mail to [employer] patient assistance  Contacted patient to let him know the status of application  Patient Self Care Activities:  . Currently UNABLE TO independently manage medications   Please see past updates related to this goal by clicking on the "Past Updates" button in the selected goal         The patient verbalized understanding of instructions provided today and declined a print copy of patient instruction materials.   Plan: - PharmD will follow up with [employer] patient assistance next week   Catie Feliz Beam, PharmD Clinical Pharmacist [facility] [phone]

## 2018-09-03 NOTE — Chronic Care Management (AMB) (Signed)
  Chronic Care Management   Follow Up Note   09/03/2018 Name: Chad Gibbs MRN: [id number] DOB: 03-20-1966  Referred by: Dorcas Carrow, DO Reason for referral : Chronic Care Management (Medication Management)   Chad Gibbs is a 53 y.o. year old male who is a primary care patient of Dorcas Carrow, DO. The CCM team was consulted for assistance with chronic disease management and care coordination needs.    Review of patient status, including review of consultants reports, relevant laboratory and other test results, and collaboration with appropriate care team members and the patient's provider was performed as part of comprehensive patient evaluation and provision of chronic care management services.    Goals Addressed            This Visit's Progress     Patient Stated   . "I can't afford my inhalers" (pt-stated)       Current Barriers:  . Financial Barriers- patient notes that he cannot afford Symbicort or albuterol inhalers;  . Patient eligible for [employer] patient assistance for [employer] and Proventil  Pharmacist Clinical Goal(s):  [name] Over the next 30 days, patient will work with PharmD to address needs related to inhaler medication access  Interventions:  Patient provided signature and financial paperwork. Worked with office staff to mail to [employer] patient assistance  Contacted patient to let him know the status of application  Patient Self Care Activities:  . Currently UNABLE TO independently manage medications   Please see past updates related to this goal by clicking on the "Past Updates" button in the selected goal         Plan: - PharmD will follow up with [employer] patient assistance next week   Catie Feliz Beam, PharmD Clinical Pharmacist [facility] [phone]

## 2018-09-05 ENCOUNTER — Ambulatory Visit: Payer: Self-pay | Admitting: Licensed Clinical Social Worker

## 2018-09-05 ENCOUNTER — Telehealth: Payer: Self-pay

## 2018-09-05 NOTE — Chronic Care Management (AMB) (Signed)
  Care Management Note   Chad Gibbs is a 53 y.o. year old male who is a primary care patient of Dorcas Carrow, DO . The CM team was consulted for assistance with [employer].   Review of patient status, including review of consultants reports, rand collaboration with appropriate care team members and the patient's provider was performed as part of comprehensive patient evaluation and provision of chronic care management services. Telephone outreach to patient today to introduce CCM services.   I reached out to [name] by phone today but was unable to reach him or leave a voice message as his mailbox has not been set up yet. LCSW will continue to make outreach attempts.   Follow Up Plan: SW will follow up with patient by phone over the next 2 weeks  Dickie La, BSW, MSW, LCSW [facility] Care Management [place]  [facility] [email or web address] Phone: [phone]

## 2018-09-12 ENCOUNTER — Ambulatory Visit: Payer: Medicare HMO | Admitting: Pharmacist

## 2018-09-12 DIAGNOSIS — J449 Chronic obstructive pulmonary disease, unspecified: Secondary | ICD-10-CM

## 2018-09-12 NOTE — Chronic Care Management (AMB) (Signed)
  Chronic Care Management   Follow Up Note   09/12/2018 Name: Chad Gibbs MRN: [id number] DOB: 1965/08/26  Referred by: Dorcas Carrow, DO Reason for referral : Chronic Care Management (Medication Management)   Chad Gibbs is a 53 y.o. year old male who is a primary care patient of Dorcas Carrow, DO. The CCM team was consulted for assistance with chronic disease management and care coordination needs.    Review of patient status, including review of consultants reports, relevant laboratory and other test results, and collaboration with appropriate care team members and the patient's provider was performed as part of comprehensive patient evaluation and provision of chronic care management services.    Goals Addressed            This Visit's Progress     Patient Stated   . "I can't afford my inhalers" (pt-stated)       Current Barriers:  . Financial Barriers- patient notes that he cannot afford inhalers;  . Patient eligible for [employer] patient assistance for [facility] and Proventil - application mailed 09/03/2018  Pharmacist Clinical Goal(s):  [name] Over the next 30 days, patient will work with PharmD to address needs related to inhaler medication access  Interventions:  Contacted [employer] patient assistance; they have not processed application, they recommended I check back next week.  Will route to [facility] pharmacy technician Noreene Larsson Simcox for follow up next week  Patient Self Care Activities:  . Currently UNABLE TO independently manage medications   Please see past updates related to this goal by clicking on the "Past Updates" button in the selected goal          Plan: - Will pass application materials along to [employer], CPhT for follow up with [employer] and eventually with patient for attestation form explanation/follow up - PharmD will follow up with patient in 2-3 weeks regarding breathing control, medication supply  Catie Feliz Beam, PharmD Clinical Pharmacist [facility] [phone]

## 2018-09-12 NOTE — Patient Instructions (Signed)
Visit Information  Goals Addressed            This Visit's Progress     Patient Stated   . "I can't afford my inhalers" (pt-stated)       Current Barriers:  . Financial Barriers- patient notes that he cannot afford inhalers;  . Patient eligible for [employer] patient assistance for [facility] and Proventil - application mailed 09/03/2018  Pharmacist Clinical Goal(s):  [name] Over the next 30 days, patient will work with PharmD to address needs related to inhaler medication access  Interventions:  Contacted [employer] patient assistance; they have not processed application, they recommended I check back next week.  Will route to [facility] pharmacy technician Noreene Larsson Simcox for follow up next week  Patient Self Care Activities:  . Currently UNABLE TO independently manage medications   Please see past updates related to this goal by clicking on the "Past Updates" button in the selected goal         The patient verbalized understanding of instructions provided today and declined a print copy of patient instruction materials.   Plan: - Will pass application materials along to [employer], CPhT for follow up with [employer] and eventually with patient for attestation form explanation/follow up - PharmD will follow up with patient in 2-3 weeks regarding breathing control, medication supply  Catie Feliz Beam, PharmD Clinical Pharmacist [facility] [phone]

## 2018-09-16 ENCOUNTER — Telehealth: Payer: Self-pay

## 2018-09-16 ENCOUNTER — Ambulatory Visit: Payer: Self-pay | Admitting: *Deleted

## 2018-09-16 NOTE — Chronic Care Management (AMB) (Signed)
  Chronic Care Management   Outreach Note  09/16/2018 Name: CHRISTOFF SHIMKO MRN: [id number] DOB: Sep 17, 1965  Referred by: Dorcas Carrow, DO Reason for referral : Chronic Care Management (Initial Outreach Financial Needs/Constraints)  I reached out to Mr. Toste by phone today on behalf of my LCSW colleague to address reported needs related to financial constraints and housing concerns. I was unable to reach Mr. Farner and unable to leave a message for him.   Follow Up Plan: The CM team will reach out to the patient again over the next 7 days. I will provide an update to my colleague for continued outreach.    Marja Kays MHA,BSN,RN,CCM Nurse Care Coordinator [facility] / [facility] Care Management [phone]

## 2018-09-17 ENCOUNTER — Other Ambulatory Visit: Payer: Self-pay | Admitting: Pharmacy Technician

## 2018-09-17 NOTE — Patient Outreach (Signed)
[facility]) Care Management  09/17/2018  Chad Gibbs Apr 25, 1966 [id number]  Care coordination call placed to [employer] in regards to patient's application for [facility] and Proventil.  Spoke to Junior who informed that they received the patient's application on 09/12/2018. The application along with the attestation letter was mailed back to the patient on the same date. Junior informed once they receive the necessary information back then they can continue to process the application and ship the medication.  Will outreach patient to be expecting the attestation letter.  Chad Gibbs, CPhT [facility] [phone]

## 2018-09-17 NOTE — Patient Outreach (Signed)
[facility]) Care Management  09/17/2018  Chad Gibbs 06/02/1966 [id number]  ADDENDUM  Unsuccessful outreach call placed to patient in regards to [employer] application for [employer] and Proventil.  Unfortunately patient did not answer the phone. Was unable to leave a message because the message said a voicemail box had not been set up and then disconnected the call.  Was calling patient to inform him about the attestation letter from [employer] that was mailed to him on 09/12/2018.   Will route note to [facility] RPh Catie Feliz Beam to assist in contacting the patient and will also followup in 3-5 business days.  Chantay Whitelock P. Brigitt Mcclish, CPhT [facility] [phone]

## 2018-09-18 ENCOUNTER — Ambulatory Visit: Payer: Self-pay | Admitting: Pharmacist

## 2018-09-18 ENCOUNTER — Telehealth: Payer: Self-pay

## 2018-09-18 DIAGNOSIS — J449 Chronic obstructive pulmonary disease, unspecified: Secondary | ICD-10-CM

## 2018-09-18 NOTE — Chronic Care Management (AMB) (Addendum)
  Chronic Care Management   Note  09/18/2018 Name: Chad Gibbs MRN: [id number] DOB: [birth year]  Chad Gibbs is a 53 y.o. year old male who is a primary care patient of Dorcas Carrow, DO. The CCM team was consulted for assistance with chronic disease management and care coordination needs.  Attempted to contacted patient today on behalf of Noreene Larsson Simcox, CPhT. Per [employer], Ec[employer] was mailed out. Was calling to let patient know that he needs to sign and return this letter to [employer].   Left message for patient to return my call.  Follow up plan: - Will attempt outreaching patient again in 2-3 business days  Catie Feliz Beam, PharmD Clinical Pharmacist [facility] Practice/[facility] [phone]

## 2018-09-23 ENCOUNTER — Ambulatory Visit: Payer: Self-pay | Admitting: Licensed Clinical Social Worker

## 2018-09-23 ENCOUNTER — Ambulatory Visit: Payer: Self-pay | Admitting: Pharmacist

## 2018-09-23 ENCOUNTER — Telehealth: Payer: Self-pay

## 2018-09-23 DIAGNOSIS — J449 Chronic obstructive pulmonary disease, unspecified: Secondary | ICD-10-CM

## 2018-09-23 NOTE — Chronic Care Management (AMB) (Signed)
  Chronic Care Management    Clinical Social Work General Note  09/23/2018 Name: Chad Gibbs MRN: [id number] DOB: [birth year]  Chad Gibbs is a 53 y.o. year old male who is a primary care patient of Dorcas Carrow, DO. The CCM was consulted to assist the patient with Community Resources in relation to housing. LCSW was unable to reach patient successfully but left a HIPPA compliant voice message encouraging a return call once available.    Review of patient status, including review of consultants reports, relevant laboratory and other test results, and collaboration with appropriate care team members and the patient's provider was performed as part of comprehensive patient evaluation and provision of chronic care management services.    Follow Up Plan: SW will follow up with patient by phone over the next 2 weeks      Chad Gibbs, BSW, MSW, LCSW [facility] Care Management [place]  [email or web address] Phone: [phone]

## 2018-09-23 NOTE — Patient Instructions (Signed)
Visit Information  Goals Addressed            This Visit's Progress     Patient Stated   . "I can't afford my inhalers" (pt-stated)       Current Barriers:  . Financial Barriers- patient notes that he cannot afford inhalers;  . Patient eligible for [employer] patient assistance for [employer] and [employer] form mailed from company on 09/12/2018  Pharmacist Clinical Goal(s):  [name] Over the next 30 days, patient will work with PharmD to address needs related to inhaler medication access  Interventions:  Patient notes that he hasn't checked his mail recently, so is unsure if he received attestation form or not. Notes that he will check the mail today. I explained the form to him, and asked him to give me a call if he hasn't received the form, or if he doesn't understand the form when he goes to fill it out. He expressed understanding  Confirmed with him that he has an appointment with Dr. Laural Benes on Thursday.   Patient Self Care Activities:  . Currently UNABLE TO independently manage medications   Please see past updates related to this goal by clicking on the "Past Updates" button in the selected goal         The patient verbalized understanding of instructions provided today and declined a print copy of patient instruction materials.   Plan:  - PharmD will outreach patient in 2-3 business days to ensure he completed and mailed back [employer] form.   Catie Feliz Beam, PharmD Clinical Pharmacist [facility] Practice/[facility] [phone]

## 2018-09-23 NOTE — Chronic Care Management (AMB) (Signed)
  Chronic Care Management   Follow Up Note   09/23/2018 Name: KOHLE BIAGIOTTI MRN: [id number] DOB: 1966-01-20  Referred by: Dorcas Carrow, DO Reason for referral : Chronic Care Management (COPD)   MUAAZ ROUTT is a 53 y.o. year old male who is a primary care patient of Dorcas Carrow, DO. The CCM team was consulted for assistance with chronic disease management and care coordination needs.    Contacted patient to discuss the [employer] Form that must be completed and mailed back to [employer] as the next step of prescription assistance application.  Review of patient status, including review of consultants reports, relevant laboratory and other test results, and collaboration with appropriate care team members and the patient's provider was performed as part of comprehensive patient evaluation and provision of chronic care management services.    Goals Addressed            This Visit's Progress     Patient Stated   . "I can't afford my inhalers" (pt-stated)       Current Barriers:  . Financial Barriers- patient notes that he cannot afford inhalers;  . Patient eligible for [employer] patient assistance for [employer] and [employer] form mailed from company on 09/12/2018  Pharmacist Clinical Goal(s):  [name] Over the next 30 days, patient will work with PharmD to address needs related to inhaler medication access  Interventions:  Patient notes that he hasn't checked his mail recently, so is unsure if he received attestation form or not. Notes that he will check the mail today. I explained the form to him, and asked him to give me a call if he hasn't received the form, or if he doesn't understand the form when he goes to fill it out. He expressed understanding  Confirmed with him that he has an appointment with Dr. Laural Benes on Thursday.   Patient Self Care Activities:  . Currently UNABLE TO independently manage medications   Please see past updates related to this  goal by clicking on the "Past Updates" button in the selected goal          Plan:  - PharmD will outreach patient in 2-3 business days to ensure he completed and mailed back [employer] form.   Catie Feliz Beam, PharmD Clinical Pharmacist [facility] Practice/[facility] [phone]

## 2018-09-24 ENCOUNTER — Ambulatory Visit: Payer: Self-pay | Admitting: Pharmacist

## 2018-09-24 ENCOUNTER — Telehealth: Payer: Medicare HMO

## 2018-09-24 DIAGNOSIS — J449 Chronic obstructive pulmonary disease, unspecified: Secondary | ICD-10-CM

## 2018-09-24 NOTE — Chronic Care Management (AMB) (Signed)
  Chronic Care Management   Follow Up Note   09/24/2018 Name: Chad Gibbs MRN: [id number] DOB: 1966-05-03  Referred by: Dorcas Carrow, DO Reason for referral : Chronic Care Management (Medication Assistance)   Chad Gibbs is a 53 y.o. year old male who is a primary care patient of Dorcas Carrow, DO. The CCM team was consulted for assistance with chronic disease management and care coordination needs.    Review of patient status, including review of consultants reports, relevant laboratory and other test results, and collaboration with appropriate care team members and the patient's provider was performed as part of comprehensive patient evaluation and provision of chronic care management services.    Goals Addressed            This Visit's Progress     Patient Stated   . "I can't afford my inhalers" (pt-stated)       Current Barriers:  . Financial Barriers- patient notes that he cannot afford inhalers;  . Patient eligible for [employer] patient assistance for [employer] and [employer] form mailed from company on 09/12/2018; Patient dropped off signed attestation form at clinic   Pharmacist Clinical Goal(s):  [name] Over the next 30 days, patient will work with PharmD to address needs related to inhaler medication access  Interventions:  Mailed attestation form back to [employer] on behalf of patient  Patient Self Care Activities:  . Currently UNABLE TO independently manage medications   Please see past updates related to this goal by clicking on the "Past Updates" button in the selected goal         Plan:  - Will notify [employer], CPhT for appropriate follow up with [employer].   Catie Feliz Beam, PharmD Clinical Pharmacist [facility] Practice/[facility] [phone]

## 2018-09-24 NOTE — Patient Instructions (Signed)
Visit Information  Goals Addressed            This Visit's Progress     Patient Stated   . "I can't afford my inhalers" (pt-stated)       Current Barriers:  . Financial Barriers- patient notes that he cannot afford inhalers;  . Patient eligible for [employer] patient assistance for [employer] and [employer] form mailed from company on 09/12/2018; Patient dropped off signed attestation form at clinic   Pharmacist Clinical Goal(s):  [name] Over the next 30 days, patient will work with PharmD to address needs related to inhaler medication access  Interventions:  Mailed attestation form back to [employer] on behalf of patient  Patient Self Care Activities:  . Currently UNABLE TO independently manage medications   Please see past updates related to this goal by clicking on the "Past Updates" button in the selected goal         The patient verbalized understanding of instructions provided today and declined a print copy of patient instruction materials.   Plan:  - Will notify [employer], CPhT for appropriate follow up with [employer].   Catie Feliz Beam, PharmD Clinical Pharmacist [facility] Practice/[facility] [phone]

## 2018-09-25 ENCOUNTER — Encounter: Payer: Self-pay | Admitting: Family Medicine

## 2018-09-25 ENCOUNTER — Other Ambulatory Visit: Payer: Self-pay

## 2018-09-25 ENCOUNTER — Ambulatory Visit (INDEPENDENT_AMBULATORY_CARE_PROVIDER_SITE_OTHER): Payer: Medicare HMO | Admitting: Family Medicine

## 2018-09-25 VITALS — BP 135/86

## 2018-09-25 DIAGNOSIS — J449 Chronic obstructive pulmonary disease, unspecified: Secondary | ICD-10-CM

## 2018-09-25 NOTE — Assessment & Plan Note (Signed)
Stable, but not under good control. Will continue to work with pharmacist to try to get his mediations. Does not appear to need steroids right now. Call with any concerns. Continue to monitor.

## 2018-09-25 NOTE — Progress Notes (Signed)
BP 135/86    Subjective:    Patient ID: Chad Gibbs, male    DOB: 05/09/1966, 53 y.o.   MRN: [id number]  HPI: Chad Gibbs is a 53 y.o. male  Chief Complaint  Patient presents with  . COPD   COPD- has not been able to get his inhalers due to cost. Still feeling pretty SOB COPD status: uncontrolled Satisfied with current treatment?: no Oxygen use: no Dyspnea frequency: pretty much all the time Cough frequency: most of the time Rescue inhaler frequency:  Doesn't have one- thinks that he would use it most of the time Limitation of activity: yes Productive cough: no Pneumovax: Up to Date Influenza: Not up to Date  Relevant past medical, surgical, family and social history reviewed and updated as indicated. Interim medical history since our last visit reviewed. Allergies and medications reviewed and updated.  Review of Systems  Constitutional: Negative.   Respiratory: Positive for cough, chest tightness, shortness of breath and wheezing. Negative for apnea, choking and stridor.   Cardiovascular: Negative.   Gastrointestinal: Negative.   Neurological: Negative.   Psychiatric/Behavioral: Negative.     Per HPI unless specifically indicated above     Objective:    BP 135/86   Wt Readings from Last 3 Encounters:  08/25/18 272 lb 3.2 oz (123.5 kg)  01/06/18 263 lb 1.6 oz (119.3 kg)  02/18/17 250 lb (113.4 kg)    Physical Exam Vitals signs and nursing note reviewed.  Pulmonary:     Effort: No respiratory distress.     Comments: Speaking in full sentences, slightly short of breath, no stridor Neurological:     Mental Status: He is alert.  Psychiatric:        Mood and Affect: Mood normal.        Behavior: Behavior normal.        Thought Content: Thought content normal.        Judgment: Judgment normal.     Results for orders placed or performed in visit on 08/25/18  Bayer DCA Hb A1c Waived  Result Value Ref Range   HB A1C (BAYER DCA - WAIVED) 5.4 <7.0 %   CBC with Differential/Platelet  Result Value Ref Range   WBC 9.8 3.4 - 10.8 x10E3/uL   RBC 4.58 4.14 - 5.80 x10E6/uL   Hemoglobin 13.2 13.0 - 17.7 g/dL   Hematocrit 74.2 59.5 - 51.0 %   MCV 87 79 - 97 fL   MCH 28.8 26.6 - 33.0 pg   MCHC 33.3 31.5 - 35.7 g/dL   RDW 63.8 75.6 - 43.3 %   Platelets 288 150 - 450 x10E3/uL   Neutrophils 62 Not Estab. %   Lymphs 26 Not Estab. %   Monocytes 8 Not Estab. %   Eos 3 Not Estab. %   Basos 1 Not Estab. %   Neutrophils Absolute 6.2 1.4 - 7.0 x10E3/uL   Lymphocytes Absolute 2.5 0.7 - 3.1 x10E3/uL   Monocytes Absolute 0.8 0.1 - 0.9 x10E3/uL   EOS (ABSOLUTE) 0.3 0.0 - 0.4 x10E3/uL   Basophils Absolute 0.1 0.0 - 0.2 x10E3/uL   Immature Granulocytes 0 Not Estab. %   Immature Grans (Abs) 0.0 0.0 - 0.1 x10E3/uL  Comprehensive metabolic panel  Result Value Ref Range   Glucose 81 65 - 99 mg/dL   BUN 11 6 - 24 mg/dL   Creatinine, Ser 2.95 0.76 - 1.27 mg/dL   GFR calc non Af Amer 88 >59 mL/min/1.73   GFR calc Af Denyse Dago  101 >59 mL/min/1.73   BUN/Creatinine Ratio 11 9 - 20   Sodium 143 134 - 144 mmol/L   Potassium 4.0 3.5 - 5.2 mmol/L   Chloride 100 96 - 106 mmol/L   CO2 26 20 - 29 mmol/L   Calcium 9.3 8.7 - 10.2 mg/dL   Total Protein 7.1 6.0 - 8.5 g/dL   Albumin 4.2 3.8 - 4.9 g/dL   Globulin, Total 2.9 1.5 - 4.5 g/dL   Albumin/Globulin Ratio 1.4 1.2 - 2.2   Bilirubin Total 0.8 0.0 - 1.2 mg/dL   Alkaline Phosphatase 88 39 - 117 IU/L   AST 14 0 - 40 IU/L   ALT 20 0 - 44 IU/L  Lipid Panel w/o Chol/HDL Ratio  Result Value Ref Range   Cholesterol, Total 201 (H) 100 - 199 mg/dL   Triglycerides 88 0 - 149 mg/dL   HDL 44 >21>39 mg/dL   VLDL Cholesterol Cal 18 5 - 40 mg/dL   LDL Calculated 308139 (H) 0 - 99 mg/dL  Microalbumin, Urine Waived  Result Value Ref Range   Microalb, Ur Waived 80 (H) 0 - 19 mg/L   Creatinine, Urine Waived 200 10 - 300 mg/dL   Microalb/Creat Ratio <30 <30 mg/g  TSH  Result Value Ref Range   TSH 2.780 0.450 - 4.500 uIU/mL   UA/M w/rflx Culture, Routine  Result Value Ref Range   Specific Gravity, UA 1.020 1.005 - 1.030   pH, UA 6.0 5.0 - 7.5   Color, UA Negative Yellow   Appearance Ur Negative Clear   Leukocytes, UA Negative Negative   Protein, UA Negative Negative/Trace   Glucose, UA Negative Negative   Ketones, UA Negative Negative   RBC, UA Negative Negative   Bilirubin, UA Negative Negative   Urobilinogen, Ur 2.0 (H) 0.2 - 1.0 mg/dL   Nitrite, UA Negative Negative  PSA  Result Value Ref Range   Prostate Specific Ag, Serum 1.1 0.0 - 4.0 ng/mL      Assessment & Plan:   Problem List Items Addressed This Visit      Respiratory   COPD (chronic obstructive pulmonary disease) (HCC) - Primary    Stable, but not under good control. Will continue to work with pharmacist to try to get his mediations. Does not appear to need steroids right now. Call with any concerns. Continue to monitor.          Follow up plan: Return in about 4 weeks (around 10/23/2018) for follow up breathing.   . This visit was completed via telephone due to the restrictions of the COVID-19 pandemic. All issues as above were discussed and addressed but no physical exam was performed. If it was felt that the patient should be evaluated in the office, they were directed there. The patient verbally consented to this visit. Patient was unable to complete an audio/visual visit due to Lack of equipment. Due to the catastrophic nature of the COVID-19 pandemic, this visit was done through audio contact only. . Location of the patient: home . Location of the provider: home . Those involved with this call:  . Provider: Olevia PerchesMegan Johnson, DO . CMA: Tiffany Reel, CMA . Front Desk/Registration: Adela Portshristan Williamson  . Time spent on call: 15 minutes on the phone discussing health concerns. 23 minutes total spent in review of patient's record and preparation of their chart.

## 2018-09-26 ENCOUNTER — Telehealth: Payer: Self-pay

## 2018-10-01 ENCOUNTER — Other Ambulatory Visit: Payer: Self-pay | Admitting: Pharmacy Technician

## 2018-10-01 NOTE — Patient Outreach (Signed)
[facility]) Care Management  10/01/2018  Chad Gibbs 11-11-1965 [id number]   Care coordination call placed to [employer] patient assistance in regards to patient's application for [facility] and Proventil.  Spoke to [place] who informed patient had been APPROVED 09/30/2018-06/11/2019. Merlene Laughter inform that it can take up to 2-3 days for the pharmacy to receive the request, 2-3 days for pharmacy to fill the order and once mailed 7-10 business days to arrive at the patient's home.  Will followup with patient in 7-14 bushiness days to inquire if medication has been received.  Jayci Ellefson P. Buster Schueller, CPhT [facility] [phone]

## 2018-10-03 ENCOUNTER — Ambulatory Visit (INDEPENDENT_AMBULATORY_CARE_PROVIDER_SITE_OTHER): Payer: Medicare HMO | Admitting: Pharmacist

## 2018-10-03 ENCOUNTER — Other Ambulatory Visit: Payer: Self-pay

## 2018-10-03 ENCOUNTER — Ambulatory Visit: Payer: Medicare HMO | Admitting: Licensed Clinical Social Worker

## 2018-10-03 DIAGNOSIS — J449 Chronic obstructive pulmonary disease, unspecified: Secondary | ICD-10-CM

## 2018-10-03 DIAGNOSIS — R062 Wheezing: Secondary | ICD-10-CM

## 2018-10-03 NOTE — Chronic Care Management (AMB) (Signed)
  Chronic Care Management    Clinical Social Work General Note  10/03/2018 Name: Chad Gibbs MRN: [id number] DOB: 1966/04/25  Chad Gibbs is a 53 y.o. year old male who is a primary care patient of Chad Carrow, DO. The CCM was consulted to assist the patient with Community Resources related to housing.   Review of patient status, including review of consultants reports, relevant laboratory and other test results, and collaboration with appropriate care team members and the patient's provider was performed as part of comprehensive patient evaluation and provision of chronic care management services.    SDOH (Social Determinants of Health) screening performed today. See Care Plan Entry related to challenges with: Housing  Financial Strain   Goals Addressed    . "I need safe and stable housing" (pt-stated)       Current Barriers:  . Financial constraints . Housing barriers . ADL IADL limitations . Lacks knowledge of community resource: available housing resources within the area that may improve patient's living situation  Clinical Social Work Clinical Goal(s):  [name] Over the next 90 days, client will work with SW to address concerns related to lack of stable/safe housing . Over the next 90 days, client will follow up with housing resoures that were mailed out to patient * as directed by SW  Interventions: . Patient interviewed and appropriate assessments performed . Provided patient with information about financial education resources as well as housing support resources . Discussed plans with patient for ongoing care management follow up and provided patient with direct contact information for care management team . Advised patient to check mailbox for community resources that will be mailed out by LCSW . Assisted patient/caregiver with obtaining information about health plan benefits  Patient Self Care Activities:  . Attends all scheduled provider appointments . Calls  provider office for new concerns or questions  Initial goal documentation   Follow Up Plan: SW will follow up with patient by phone over the next 2-4 weeks   Dickie La, BSW, MSW, LCSW [facility] Care Management [place]  [facility] [email or web address] Phone: [phone]

## 2018-10-03 NOTE — Patient Instructions (Signed)
Licensed Clinical Social Worker Visit Information  Goals we discussed today:  Goals Addressed    . "I need safe and stable housing" (pt-stated)       Current Barriers:  . Financial constraints . Housing barriers . ADL IADL limitations . Lacks knowledge of community resource: available housing resources within the area that may improve patient's living situation  Clinical Social Work Clinical Goal(s):  [name] Over the next 90 days, client will work with SW to address concerns related to lack of stable/safe housing . Over the next 90 days, client will follow up with housing resoures that were mailed out to patient * as directed by SW  Interventions: . Patient interviewed and appropriate assessments performed . Provided patient with information about financial education resources as well as housing support resources . Discussed plans with patient for ongoing care management follow up and provided patient with direct contact information for care management team . Advised patient to check mailbox for community resources that will be mailed out by LCSW . Assisted patient/caregiver with obtaining information about health plan benefits  Patient Self Care Activities:  . Attends all scheduled provider appointments . Calls provider office for new concerns or questions  Initial goal documentation   Materials provided: Verbal education about financial support resources provided by phone  The patient verbalized understanding of instructions provided today and declined a print copy of patient instruction materials.   Follow up plan: SW will follow up with patient by phone over the next 2-4 weeks  Dickie La, BSW, MSW, LCSW [facility] Care Management [place]  [facility] [email or web address] Phone: [phone]

## 2018-10-03 NOTE — Chronic Care Management (AMB) (Signed)
  Chronic Care Management   Follow Up Note   10/03/2018 Name: Chad Gibbs MRN: [id number] DOB: Jul 29, 1965  Referred by: Dorcas Carrow, DO Reason for referral : Chronic Care Management (Medication Management)   Chad Gibbs is a 53 y.o. year old male who is a primary care patient of Dorcas Carrow, DO. The CCM team was consulted for assistance with chronic disease management and care coordination needs.    Contacted patient telephonically today.  Review of patient status, including review of consultants reports, relevant laboratory and other test results, and collaboration with appropriate care team members and the patient's provider was performed as part of comprehensive patient evaluation and provision of chronic care management services.    Goals Addressed            This Visit's Progress     Patient Stated   . "I can't afford my inhalers" (pt-stated)       Current Barriers:  . Financial Barriers- patient notes that he cannot afford inhalers;  . Patient APPROVED for [employer] assistance for [employer] and Proventil through 06/11/2019  Pharmacist Clinical Goal(s):  [name] Over the next 30 days, patient will work with PharmD to address needs related to inhaler medication access  Interventions:  Outreached patient; informed that he was approved. Let him know to be expecting inhalers mailed to his home in 7-10 business days, and that [employer], CPhT would outreach him to ensure he received the medications  Patient Self Care Activities:  . Currently UNABLE TO independently manage medications   Please see past updates related to this goal by clicking on the "Past Updates" button in the selected goal        Plan: - PharmD will outreach patient in the next 2-3 weeks to assess breathing and symptom management   Catie Feliz Beam, PharmD Clinical Pharmacist [facility] [phone]

## 2018-10-03 NOTE — Patient Instructions (Signed)
Visit Information  Goals Addressed            This Visit's Progress     Patient Stated   . "I can't afford my inhalers" (pt-stated)       Current Barriers:  . Financial Barriers- patient notes that he cannot afford inhalers;  . Patient APPROVED for [employer] assistance for [employer] and Proventil through 06/11/2019  Pharmacist Clinical Goal(s):  [name] Over the next 30 days, patient will work with PharmD to address needs related to inhaler medication access  Interventions:  Outreached patient; informed that he was approved. Let him know to be expecting inhalers mailed to his home in 7-10 business days, and that [employer], CPhT would outreach him to ensure he received the medications  Patient Self Care Activities:  . Currently UNABLE TO independently manage medications   Please see past updates related to this goal by clicking on the "Past Updates" button in the selected goal         The patient verbalized understanding of instructions provided today and declined a print copy of patient instruction materials.   Plan: - PharmD will outreach patient in the next 2-3 weeks to assess breathing and symptom management   Catie Feliz Beam, PharmD Clinical Pharmacist [facility] [phone]

## 2018-10-13 ENCOUNTER — Other Ambulatory Visit: Payer: Self-pay | Admitting: Pharmacy Technician

## 2018-10-13 NOTE — Patient Outreach (Signed)
[facility]) Care Management  10/13/2018  KALIB STARKOVICH [birth year] [id number]   Care coordination call placed to [employer] in regards to patient's application for [facility] and Proventil.  Spoke to [place] who informed patient was approved on 09/30/2018-06/11/2019.  She informed the medicaiton was shipped on 10/02/2018 and is out for delivery today 10/13/2018. The tracking number is [id number]. She informed patient will be receiving 3 Dulera and either 1 or 4 Proventil HFA. She was unable to make out the number clearly on her application.  Will outreach patient to inquire if he has received the medication.  Cayton Cuevas P. Lataysha Vohra, CPhT [facility] [phone]

## 2018-10-15 ENCOUNTER — Ambulatory Visit: Payer: Self-pay | Admitting: Licensed Clinical Social Worker

## 2018-10-15 NOTE — Chronic Care Management (AMB) (Signed)
  Chronic Care Management    Clinical Social Work CCM Outreach Note  10/15/2018 Name: EDWARDO TULEY MRN: [id number] DOB: 09-04-1965  [name] is a 53 y.o. year old male who is a primary care patient of Dorcas Carrow, DO . The CCM team was consulted for assistance with Financial and Housing support.   LCSW reached out to [name] today by phone to reassess social work needs but was unable to reach family successfully. LCSW left a HIPPA compliant voice message encouraging patient to return call once available.   Follow Up Plan: SW will follow up with patient by phone over the next 30 days  Dickie La, BSW, MSW, LCSW [facility] Care [facility] [place]  [facility] [email or web address] Phone: [phone]

## 2018-10-17 ENCOUNTER — Other Ambulatory Visit: Payer: Self-pay | Admitting: Pharmacy Technician

## 2018-10-17 NOTE — Patient Outreach (Signed)
[facility]) Care Management  10/17/2018  Chad Gibbs 1966/05/31 [id number]  Successful outgoing call placed to patient in regards to [employer] patient assistance for [facility] and Proventil.  Spoke to patient, HIPAA identifiers verified.  Patient informed that he had received both of his inhalers in the mail. Discussed with patient how to obtain his refills and patient verbalized understanding. Inquired if patient had any other questions or concerns at it relates to patient assistance and patient informed that he did not. Confirmed patient had my name and number for future reference if needed.  Will route note to [facility] RPh Catie Feliz Beam for case closure and remove myself from care team as patient assistance has been completed.  Maressa Apollo P. Emberleigh Reily, CPhT [facility] [phone]

## 2018-10-20 ENCOUNTER — Other Ambulatory Visit: Payer: Self-pay

## 2018-10-20 ENCOUNTER — Encounter: Payer: Self-pay | Admitting: Family Medicine

## 2018-10-20 ENCOUNTER — Ambulatory Visit (INDEPENDENT_AMBULATORY_CARE_PROVIDER_SITE_OTHER): Payer: Medicare HMO | Admitting: Family Medicine

## 2018-10-20 VITALS — BP 122/88 | HR 102 | Temp 98.7°F | Wt 291.0 lb

## 2018-10-20 DIAGNOSIS — J449 Chronic obstructive pulmonary disease, unspecified: Secondary | ICD-10-CM | POA: Diagnosis not present

## 2018-10-20 DIAGNOSIS — R0602 Shortness of breath: Secondary | ICD-10-CM

## 2018-10-20 DIAGNOSIS — R600 Localized edema: Secondary | ICD-10-CM

## 2018-10-20 MED ORDER — CEPHALEXIN 250 MG PO CAPS: 250.0000 mg | ORAL_CAPSULE | Freq: Two times a day (BID) | ORAL | 0 refills | Status: DC

## 2018-10-20 MED ORDER — FUROSEMIDE 40 MG PO TABS
40.0000 mg | ORAL_TABLET | Freq: Every day | ORAL | 0 refills | Status: DC
Start: 2018-10-20 — End: 2018-10-30

## 2018-10-20 NOTE — Patient Instructions (Addendum)
FOR YOUR STOCKINGS  [facility]  Medical supply store  [address] [place], [place]  Open ? Closes 5:30PM  [phone]  Give them the script for compression stockings    FOR [facility] [address] [place], [place] [address] [phone]

## 2018-10-20 NOTE — Progress Notes (Signed)
BP 122/88   Pulse (!) 102   Temp 98.7 F (37.1 C) (Oral)   Wt 291 lb (132 kg)   SpO2 96%   BMI 38.39 kg/m    Subjective:    Patient ID: Chad Gibbs, male    DOB: [birth year], 53 y.o.   MRN: [id number]  HPI: ERVINE PEMBLETON is a 53 y.o. male  Chief Complaint  Patient presents with  . Edema    Lower extremities, bilateral.    Patient walked into clinic this morning concerned over significant and worsening LE edema and blisters b/l. States "I would chop both my legs off right now if I could, they hurt so bad". States he's gained about 30 lb the past month without any changes to routine. Has chronic SOB due to COPD and asthma, but notes worsening lately particularly with any climbing of stairs or excessive walking. Has to sleep in a recliner, can't lay in the bed at any elevation to sleep at this time. Has not tried any compression or elevation for his swelling, notes this has never happened to him before. The blisters appeared within the last day or so and he's been using iodine on them. Denies fevers, chills, body aches, hx of known cardiac conditions. Current smoker, hx of alcoholism. Currently on dulera and albuterol for his COPD which does help some.   Relevant past medical, surgical, family and social history reviewed and updated as indicated. Interim medical history since our last visit reviewed. Allergies and medications reviewed and updated.  Review of Systems  Per HPI unless specifically indicated above     Objective:    BP 122/88   Pulse (!) 102   Temp 98.7 F (37.1 C) (Oral)   Wt 291 lb (132 kg)   SpO2 96%   BMI 38.39 kg/m   Wt Readings from Last 3 Encounters:  10/20/18 291 lb (132 kg)  08/25/18 272 lb 3.2 oz (123.5 kg)  01/06/18 263 lb 1.6 oz (119.3 kg)    Physical Exam Vitals signs and nursing note reviewed.  Constitutional:      Appearance: He is obese.  HENT:     Mouth/Throat:     Mouth: Mucous membranes are moist.  Eyes:     Extraocular  Movements: Extraocular movements intact.     Conjunctiva/sclera: Conjunctivae normal.  Neck:     Musculoskeletal: Normal range of motion and neck supple.     Comments: No JVD noted Cardiovascular:     Rate and Rhythm: Regular rhythm. Tachycardia present.  Pulmonary:     Comments: Breath sounds decreased diffusely No obvious rales or wheezes Mildly labored breathing, particularly with exertion Musculoskeletal:        General: Swelling (symmetric b/l LE 2+ pitting edema with mild erythema and blistering, worst on left anterior lowerleg) and tenderness (significantly ttp b/l LEs) present.  Neurological:     Gait: Gait normal.  Psychiatric:     Comments: Hx of TBI, fairly clear minded today but tends to get confused during conversation. Unsure what his baseline is.      Results for orders placed or performed in visit on 10/20/18  Comprehensive metabolic panel  Result Value Ref Range   Glucose 96 65 - 99 mg/dL   BUN 14 6 - 24 mg/dL   Creatinine, Ser 4.62 0.76 - 1.27 mg/dL   GFR calc non Af Amer 85 >59 mL/min/1.73   GFR calc Af Amer 99 >59 mL/min/1.73   BUN/Creatinine Ratio 14 9 - 20  Sodium 140 134 - 144 mmol/L   Potassium 3.8 3.5 - 5.2 mmol/L   Chloride 102 96 - 106 mmol/L   CO2 21 20 - 29 mmol/L   Calcium 8.7 8.7 - 10.2 mg/dL   Total Protein 6.9 6.0 - 8.5 g/dL   Albumin 4.1 3.8 - 4.9 g/dL   Globulin, Total 2.8 1.5 - 4.5 g/dL   Albumin/Globulin Ratio 1.5 1.2 - 2.2   Bilirubin Total 0.7 0.0 - 1.2 mg/dL   Alkaline Phosphatase 81 39 - 117 IU/L   AST 19 0 - 40 IU/L   ALT 22 0 - 44 IU/L  B Nat Peptide  Result Value Ref Range   BNP 748.9 (H) 0.0 - 100.0 pg/mL      Assessment & Plan:   Problem List Items Addressed This Visit      Respiratory   COPD (chronic obstructive pulmonary disease) (HCC)    Difficult to discern how much of his current SOB is related to COPD vs possible HF/pleural effusion. Will obtain CXR as well as CMP and BNP for further evaluation. Continue inhaler  regimen. Recommended smoking cessation, pt declines at this time      Relevant Medications   azithromycin (ZITHROMAX) 250 MG tablet    Other Visit Diagnoses    SOB (shortness of breath)    -  Primary   Relevant Orders   DG Chest 2 View (Completed)   Bilateral leg edema       Relevant Orders   Comprehensive metabolic panel (Completed)   B Nat Peptide (Completed)   Referral to Chronic Care Management Services    Discussed at length with patient some possibilities regarding his new onset LE edema, particularly HF given the other sxs he's presenting with including orthopnea, DOE. Await lab results, start lasix 40 mg daily to help remove some of the excess fluid. Will obtain CXR. Start keflex in case blisters start to become infected, suspect they are purely related to the swelling at this time but he also has not been having them covered well since onset. Script given for compression stockings, patient educated on the use of them plus leg elevation, salt restriction, daily weights with a log. Will adjust based on lab findings and have him f/u in office in 1 week for a recheck. Patient counseled extensively on return precautions to ER if sxs worsening at any point. He is agreeable to plan. Called and relayed all of this information to his spouse as requested, as well. Will also place CCM referral for chronic care management guidance in his case.      Greater than 40 min spent in direct patient care, counseling, and coordination of care.   Follow up plan: Return in about 1 week (around 10/27/2018) for In person leg swelling and SOB f/u.

## 2018-10-21 ENCOUNTER — Ambulatory Visit
Admission: RE | Admit: 2018-10-21 | Discharge: 2018-10-21 | Disposition: A | Discharge status: Home / Self Care | Payer: Medicare HMO | Source: Ambulatory Visit | Attending: Family Medicine | Admitting: Family Medicine

## 2018-10-21 ENCOUNTER — Other Ambulatory Visit: Payer: Self-pay

## 2018-10-21 ENCOUNTER — Ambulatory Visit
Admission: RE | Admit: 2018-10-21 | Discharge: 2018-10-21 | Disposition: A | Discharge status: Home / Self Care | Payer: Medicare HMO | Attending: Family Medicine | Admitting: Family Medicine

## 2018-10-21 ENCOUNTER — Telehealth: Payer: Self-pay | Admitting: Family Medicine

## 2018-10-21 DIAGNOSIS — R0602 Shortness of breath: Secondary | ICD-10-CM

## 2018-10-21 DIAGNOSIS — R05 Cough: Secondary | ICD-10-CM | POA: Diagnosis not present

## 2018-10-21 NOTE — Telephone Encounter (Signed)
Called and spoke with his wife, Clydie Braun, and discussed plan of care at length with her. Answered all questions, discussed that we would be in touch as results came through for him and to call back or take him to ER if sxs worsening prior to 1 week f/u  Copied from CRM [id number]. Topic: General - Other >> Oct 20, 2018  1:26 PM Laural Benes, [place] C wrote: Reason for CRM: pt's spouse called in to request a call back to discuss today's visit. Spouse says that pt is unable to tell her the details of the visit.    CB: [phone]

## 2018-10-22 ENCOUNTER — Telehealth: Payer: Self-pay | Admitting: Family Medicine

## 2018-10-22 DIAGNOSIS — R0602 Shortness of breath: Secondary | ICD-10-CM

## 2018-10-22 DIAGNOSIS — R7989 Other specified abnormal findings of blood chemistry: Secondary | ICD-10-CM

## 2018-10-22 DIAGNOSIS — R600 Localized edema: Secondary | ICD-10-CM

## 2018-10-22 LAB — COMPREHENSIVE METABOLIC PANEL
ALT: 22 IU/L (ref 0–44)
AST: 19 IU/L (ref 0–40)
Albumin/Globulin Ratio: 1.5 (ref 1.2–2.2)
Albumin: 4.1 g/dL (ref 3.8–4.9)
Alkaline Phosphatase: 81 IU/L (ref 39–117)
BUN/Creatinine Ratio: 14 (ref 9–20)
BUN: 14 mg/dL (ref 6–24)
Bilirubin Total: 0.7 mg/dL (ref 0.0–1.2)
CO2: 21 mmol/L (ref 20–29)
Calcium: 8.7 mg/dL (ref 8.7–10.2)
Chloride: 102 mmol/L (ref 96–106)
Creatinine, Ser: 0.99 mg/dL (ref 0.76–1.27)
GFR calc Af Amer: 99 mL/min/{1.73_m2} (ref >59)
GFR calc non Af Amer: 85 mL/min/{1.73_m2} (ref >59)
Globulin, Total: 2.8 g/dL (ref 1.5–4.5)
Glucose: 96 mg/dL (ref 65–99)
Potassium: 3.8 mmol/L (ref 3.5–5.2)
Sodium: 140 mmol/L (ref 134–144)
Total Protein: 6.9 g/dL (ref 6.0–8.5)

## 2018-10-22 LAB — BRAIN NATRIURETIC PEPTIDE: BNP: 748.9 pg/mL — ABNORMAL HIGH (ref 0.0–100.0)

## 2018-10-22 NOTE — Telephone Encounter (Signed)
Left VM for him to return call to discuss lab results - elevated BNP which in addition to his sxs could indicate new onset HF. Will refer urgently to Cardiology for further management. Also CXR showing a possible infiltrate, will change keflex to azithromycin for better pneumonia coverage. He should still f/u as scheduled with Dr. Laural Benes next week and go right to ER if sxs worsening in meantime

## 2018-10-22 NOTE — Telephone Encounter (Signed)
Called and left patient a VM asking for him to please return my call for lab results.

## 2018-10-23 ENCOUNTER — Telehealth: Payer: Self-pay | Admitting: Family Medicine

## 2018-10-23 DIAGNOSIS — Z7689 Persons encountering health services in other specified circumstances: Secondary | ICD-10-CM | POA: Diagnosis not present

## 2018-10-23 DIAGNOSIS — R600 Localized edema: Secondary | ICD-10-CM | POA: Diagnosis not present

## 2018-10-23 DIAGNOSIS — R0602 Shortness of breath: Secondary | ICD-10-CM | POA: Diagnosis not present

## 2018-10-23 DIAGNOSIS — R9431 Abnormal electrocardiogram [ECG] [EKG]: Secondary | ICD-10-CM | POA: Diagnosis not present

## 2018-10-23 DIAGNOSIS — E782 Mixed hyperlipidemia: Secondary | ICD-10-CM | POA: Diagnosis not present

## 2018-10-23 MED ORDER — AZITHROMYCIN 250 MG PO TABS: ORAL_TABLET | ORAL | 0 refills | Status: DC

## 2018-10-23 NOTE — Telephone Encounter (Signed)
Patient notified

## 2018-10-23 NOTE — Telephone Encounter (Signed)
Tried patient again, was able to reach him and had long discussion about test results and possible implications. He knows to go pick up zpak and stop keflex, and to watch his phone closely for a call about a Cardiology appt to get all this sorted. He is still SOB but not quite as bad, knows to go to ER if sxs worsening before being seen by Cardiology

## 2018-10-23 NOTE — Assessment & Plan Note (Signed)
Difficult to discern how much of his current SOB is related to COPD vs possible HF/pleural effusion. Will obtain CXR as well as CMP and BNP for further evaluation. Continue inhaler regimen. Recommended smoking cessation, pt declines at this time

## 2018-10-23 NOTE — Telephone Encounter (Signed)
They did not appear currently infected when I saw him in clinic, but we did start him on abx as a precaution. They are likely to drain clear fluid due to his significant swelling but he should let us know if things worsen. He does have a recheck in 4 days scheduled and I just spoke with him this morning about changing to a different antibiotic (both that he's been on should keep from getting an infection in them)  Copied from CRM [id number]. Topic: General - Other >> Oct 23, 2018  9:03 AM Jaquita Rector A wrote: Reason for CRM: Patient called to inquire of Chad Gibbs about the sores on his left leg asking if the blood work that he had done this morning will show signs of infection. He is asking for a call back to discuss the sores Ph# [phone]

## 2018-10-27 ENCOUNTER — Ambulatory Visit: Payer: Self-pay | Admitting: Licensed Clinical Social Worker

## 2018-10-27 ENCOUNTER — Encounter: Payer: Self-pay | Admitting: Family Medicine

## 2018-10-27 ENCOUNTER — Other Ambulatory Visit: Payer: Self-pay

## 2018-10-27 ENCOUNTER — Ambulatory Visit (INDEPENDENT_AMBULATORY_CARE_PROVIDER_SITE_OTHER): Payer: Medicare HMO | Admitting: Family Medicine

## 2018-10-27 VITALS — BP 102/71 | HR 103 | Temp 98.4°F | Ht 73.0 in | Wt 286.0 lb

## 2018-10-27 DIAGNOSIS — R0602 Shortness of breath: Secondary | ICD-10-CM

## 2018-10-27 DIAGNOSIS — I509 Heart failure, unspecified: Secondary | ICD-10-CM

## 2018-10-27 NOTE — Progress Notes (Signed)
BP 102/71   Pulse (!) 103   Temp 98.4 F (36.9 C) (Oral)   Ht 6\' 1"  (1.854 m)   Wt 286 lb (129.7 kg)   SpO2 95%   BMI 37.73 kg/m    Subjective:    Patient ID: Chad Gibbs, male    DOB: May 31, 1966, 53 y.o.   MRN: [id number]  HPI: Chad Gibbs is a 53 y.o. male  Chief Complaint  Patient presents with  . Leg Swelling    f/u  . Shortness of Breath   Chad Gibbs presents today for follow up on his SOB. He was seen 1 week ago for SOB, unsure at that time if it was CHF or COPD, started on azithromycin and 40mg  lasix and CXR obtained. CXR and BNP indicated new onset CHF. He saw cardiology 4 days ago for a new patient appointment. They wanted him to continue his lasix and get echo, stress test and sleep study. He is due to see him again at the end of the week.   He notes that his weight has stabilized. He has not been wearing his compression stockings today because of his appointment. He notes that he wanted [place] to be able to see them. He feels like his breathing is no better. He is not any worse. He has been taking the antibiotic and has finished it. He has been taking his lasix. Legs still hurting. Still really swollen. He is otherwise doing OK with no other concerns or complaints at this time.   Relevant past medical, surgical, family and social history reviewed and updated as indicated. Interim medical history since our last visit reviewed. Allergies and medications reviewed and updated.  Review of Systems  Constitutional: Negative.   HENT: Negative.   Respiratory: Positive for shortness of breath. Negative for apnea, cough, choking, chest tightness, wheezing and stridor.   Cardiovascular: Positive for leg swelling. Negative for chest pain and palpitations.  Gastrointestinal: Negative.   Neurological: Negative.   Psychiatric/Behavioral: Negative.     Per HPI unless specifically indicated above     Objective:    BP 102/71   Pulse (!) 103   Temp 98.4 F (36.9 C) (Oral)    Ht 6\' 1"  (1.854 m)   Wt 286 lb (129.7 kg)   SpO2 95%   BMI 37.73 kg/m   Wt Readings from Last 3 Encounters:  10/27/18 286 lb (129.7 kg)  10/20/18 291 lb (132 kg)  08/25/18 272 lb 3.2 oz (123.5 kg)    Physical Exam Vitals signs and nursing note reviewed.  Constitutional:      General: He is not in acute distress.    Appearance: Normal appearance. He is well-developed. He is obese. He is ill-appearing. He is not toxic-appearing or diaphoretic.  HENT:     Head: Normocephalic and atraumatic.     Right Ear: External ear normal.     Left Ear: External ear normal.     Nose: Nose normal.     Mouth/Throat:     Mouth: Mucous membranes are moist.     Pharynx: Oropharynx is clear.  Eyes:     General: No scleral icterus.       Right eye: No discharge.        Left eye: No discharge.     Extraocular Movements: Extraocular movements intact.     Conjunctiva/sclera: Conjunctivae normal.     Pupils: Pupils are equal, round, and reactive to light.  Neck:     Musculoskeletal: Normal range of  motion and neck supple.  Cardiovascular:     Rate and Rhythm: Normal rate and regular rhythm.     Pulses: Normal pulses.     Heart sounds: Normal heart sounds. No murmur. No friction rub. No gallop.   Pulmonary:     Effort: Accessory muscle usage present. No respiratory distress.     Breath sounds: No stridor. Examination of the right-lower field reveals rales. Examination of the left-lower field reveals rales. Rales present. No decreased breath sounds, wheezing or rhonchi.  Chest:     Chest wall: No tenderness.  Musculoskeletal: Normal range of motion.     Right lower leg: Edema (3+ edema bilaterally) present.     Left lower leg: Edema present.  Skin:    General: Skin is warm and dry.     Capillary Refill: Capillary refill takes less than 2 seconds.     Coloration: Skin is not cyanotic, jaundiced or pale.     Findings: No bruising, ecchymosis, erythema, lesion or rash.  Neurological:     General:  No focal deficit present.     Mental Status: He is alert and oriented to person, place, and time. Mental status is at baseline.  Psychiatric:        Mood and Affect: Mood normal.        Behavior: Behavior normal.        Thought Content: Thought content normal.        Judgment: Judgment normal.     Results for orders placed or performed in visit on 10/20/18  Comprehensive metabolic panel  Result Value Ref Range   Glucose 96 65 - 99 mg/dL   BUN 14 6 - 24 mg/dL   Creatinine, Ser 1.610.99 0.76 - 1.27 mg/dL   GFR calc non Af Amer 85 >59 mL/min/1.73   GFR calc Af Amer 99 >59 mL/min/1.73   BUN/Creatinine Ratio 14 9 - 20   Sodium 140 134 - 144 mmol/L   Potassium 3.8 3.5 - 5.2 mmol/L   Chloride 102 96 - 106 mmol/L   CO2 21 20 - 29 mmol/L   Calcium 8.7 8.7 - 10.2 mg/dL   Total Protein 6.9 6.0 - 8.5 g/dL   Albumin 4.1 3.8 - 4.9 g/dL   Globulin, Total 2.8 1.5 - 4.5 g/dL   Albumin/Globulin Ratio 1.5 1.2 - 2.2   Bilirubin Total 0.7 0.0 - 1.2 mg/dL   Alkaline Phosphatase 81 39 - 117 IU/L   AST 19 0 - 40 IU/L   ALT 22 0 - 44 IU/L  B Nat Peptide  Result Value Ref Range   BNP 748.9 (H) 0.0 - 100.0 pg/mL      Assessment & Plan:   Problem List Items Addressed This Visit    None    Visit Diagnoses    Acute congestive heart failure, unspecified heart failure type (HCC)    -  Primary   3+ edema, rales. Increase lasix to 80mg  daily and recheck in 2-3 days. Seeing cardiology on Friday. Call with any concerns.    Relevant Orders   Basic metabolic panel       Follow up plan: Return Wednesday or Thursday, for follow up CHF.

## 2018-10-27 NOTE — Chronic Care Management (AMB) (Signed)
  Chronic Care Management    Clinical Social Work General Note  10/27/2018 Name: KAIA DUDERSTADT MRN: [id number] DOB: [birth year]  [name] is a 53 y.o. year old male who is a primary care patient of Dorcas Carrow, DO. The CCM was consulted to assist the patient with housing.  Review of patient status, including review of consultants reports, relevant laboratory and other test results, and collaboration with appropriate care team members and the patient's provider was performed as part of comprehensive patient evaluation and provision of chronic care management services.    Goals Addressed    . "I need safe and stable housing" (pt-stated)       Current Barriers:  . Financial constraints . Housing barriers . ADL IADL limitations . Lacks knowledge of community resource: available housing resources within the area that may improve patient's living situation  Clinical Social Work Clinical Goal(s):  [name] Over the next 90 days, client will work with SW to address concerns related to lack of stable/safe housing . Over the next 90 days, client will follow up with housing resoures that were mailed out to patient (Additional housing resources mailed out on 10/27/2018 as well)* as directed by SW  Interventions: . Patient interviewed and appropriate assessments performed . Provided patient with information about financial education resources as well as housing support resources . Provided emotional support and reflective listening to family as they explained their living situation. Family own their mobile home which is no longer safe to reside in and they have made the decision that they need to relocate and sell this mobile house. Family are open to gaining new housing (rent or buy) and may need to relocate before they are able to sell their mobile home.   . Discussed plans with patient for ongoing care management follow up and provided patient with direct contact information for care management  team . Advised patient to check mailbox for additional community resources that will be mailed out today on 10/27/2018 . Assisted patient/caregiver with obtaining information about health plan benefits and available resources that may support family at this time  Patient Self Care Activities:  . Attends all scheduled provider appointments . Calls provider office for new concerns or questions  Please see past updates related to this goal by clicking on the "Past Updates" button in the selected goal    Follow Up Plan: SW will follow up with patient by phone over the next month      Dickie La, BSW, MSW, LCSW [facility] Care [facility] [place]  [facility] [email or web address] Phone: [phone]

## 2018-10-28 ENCOUNTER — Encounter: Payer: Self-pay | Admitting: Family Medicine

## 2018-10-28 LAB — BASIC METABOLIC PANEL
BUN/Creatinine Ratio: 15 (ref 9–20)
BUN: 14 mg/dL (ref 6–24)
CO2: 24 mmol/L (ref 20–29)
Calcium: 8.9 mg/dL (ref 8.7–10.2)
Chloride: 98 mmol/L (ref 96–106)
Creatinine, Ser: 0.94 mg/dL (ref 0.76–1.27)
GFR calc Af Amer: 105 mL/min/{1.73_m2} (ref >59)
GFR calc non Af Amer: 91 mL/min/{1.73_m2} (ref >59)
Glucose: 131 mg/dL — ABNORMAL HIGH (ref 65–99)
Potassium: 3.9 mmol/L (ref 3.5–5.2)
Sodium: 138 mmol/L (ref 134–144)

## 2018-10-29 ENCOUNTER — Ambulatory Visit: Payer: Medicare HMO | Admitting: Family Medicine

## 2018-10-29 ENCOUNTER — Telehealth: Payer: Medicare HMO

## 2018-10-29 NOTE — Progress Notes (Deleted)
Called patient 3x at pre-arranged time. Patient did not answer the phone. VM left.

## 2018-10-30 ENCOUNTER — Encounter: Payer: Self-pay | Admitting: Family Medicine

## 2018-10-30 ENCOUNTER — Telehealth: Payer: Self-pay | Admitting: Family Medicine

## 2018-10-30 ENCOUNTER — Other Ambulatory Visit: Payer: Self-pay

## 2018-10-30 ENCOUNTER — Ambulatory Visit (INDEPENDENT_AMBULATORY_CARE_PROVIDER_SITE_OTHER): Payer: Medicare HMO | Admitting: Family Medicine

## 2018-10-30 DIAGNOSIS — I509 Heart failure, unspecified: Secondary | ICD-10-CM

## 2018-10-30 MED ORDER — FUROSEMIDE 40 MG PO TABS: 80.0000 mg | ORAL_TABLET | Freq: Every day | ORAL | 0 refills | Status: DC

## 2018-10-30 NOTE — Progress Notes (Signed)
There were no vitals taken for this visit.   Subjective:    Patient ID: Chad Gibbs, male    DOB: 1965-11-07, 53 y.o.   MRN: [id number]  HPI: Chad Gibbs is a 53 y.o. male  Chief Complaint  Patient presents with  . Congestive Heart Failure   States that his legs are feeling better. He has been elevating his legs. Not as short of breath. Breathing has been feeling better, but still not there. Having some pain in his R side. He notes that he is feeling a bit better. Going tomorrow for his ECHO. No dizziness. He is otherwise doing well. No other concerns or complaints at this time.   Relevant past medical, surgical, family and social history reviewed and updated as indicated. Interim medical history since our last visit reviewed. Allergies and medications reviewed and updated.  Review of Systems  Constitutional: Negative.   Respiratory: Positive for shortness of breath. Negative for apnea, cough, choking, chest tightness, wheezing and stridor.   Cardiovascular: Positive for leg swelling. Negative for chest pain and palpitations.  Musculoskeletal: Negative.   Neurological: Negative.   Psychiatric/Behavioral: Negative.     Per HPI unless specifically indicated above     Objective:    There were no vitals taken for this visit.  Wt Readings from Last 3 Encounters:  10/27/18 286 lb (129.7 kg)  10/20/18 291 lb (132 kg)  08/25/18 272 lb 3.2 oz (123.5 kg)    Physical Exam Vitals signs and nursing note reviewed.  Pulmonary:     Effort: Pulmonary effort is normal. No respiratory distress.     Comments: Speaking in full sentences, mildly short of breath- significantly better than last visit Neurological:     Mental Status: He is alert.  Psychiatric:        Mood and Affect: Mood normal.        Behavior: Behavior normal.        Thought Content: Thought content normal.        Judgment: Judgment normal.     Results for orders placed or performed in visit on 10/27/18   Basic metabolic panel  Result Value Ref Range   Glucose 131 (H) 65 - 99 mg/dL   BUN 14 6 - 24 mg/dL   Creatinine, Ser 6.96 0.76 - 1.27 mg/dL   GFR calc non Af Amer 91 >59 mL/min/1.73   GFR calc Af Amer 105 >59 mL/min/1.73   BUN/Creatinine Ratio 15 9 - 20   Sodium 138 134 - 144 mmol/L   Potassium 3.9 3.5 - 5.2 mmol/L   Chloride 98 96 - 106 mmol/L   CO2 24 20 - 29 mmol/L   Calcium 8.9 8.7 - 10.2 mg/dL      Assessment & Plan:   Problem List Items Addressed This Visit    None    Visit Diagnoses    Acute congestive heart failure, unspecified heart failure type (HCC)    -  Primary   To have his ECHO tomorrow. Continue BID lasix (80mg  daily) until his follow up with cardiology/us. Call wiht any concerns. Continue to monitor.    Relevant Medications   furosemide (LASIX) 40 MG tablet       Follow up plan: Return As scheduled for next week.   . This visit was completed via telephone due to the restrictions of the COVID-19 pandemic. All issues as above were discussed and addressed but no physical exam was performed. If it was felt that the patient should  be evaluated in the office, they were directed there. The patient verbally consented to this visit. Patient was unable to complete an audio/visual visit due to Lack of equipment. Due to the catastrophic nature of the COVID-19 pandemic, this visit was done through audio contact only. . Location of the patient: home . Location of the provider: home . Those involved with this call:  . Provider: Olevia PerchesMegan , DO . CMA: Tiffany Reel, CMA . Front Desk/Registration: Harriet PhoJoliza   . Time spent on call: 21 minutes on the phone discussing health concerns. 24 minutes total spent in review of patient's record and preparation of their chart.

## 2018-10-30 NOTE — Telephone Encounter (Signed)
Unable to contact patient for scheduled appointments for follow up on his CHF for the past 2 days. Please schedule him an in-office visit for follow up on his CHF with whoever is in the office ASAP. If you do get him on the phone- please confirm numbers, as the ones we have do not seem to work. Thanks.

## 2018-10-30 NOTE — Telephone Encounter (Signed)
Patient seen today. Can disregard below.

## 2018-10-31 DIAGNOSIS — R0602 Shortness of breath: Secondary | ICD-10-CM | POA: Diagnosis not present

## 2018-10-31 DIAGNOSIS — R9431 Abnormal electrocardiogram [ECG] [EKG]: Secondary | ICD-10-CM | POA: Diagnosis not present

## 2018-11-06 DIAGNOSIS — R600 Localized edema: Secondary | ICD-10-CM | POA: Diagnosis not present

## 2018-11-06 DIAGNOSIS — I350 Nonrheumatic aortic (valve) stenosis: Secondary | ICD-10-CM | POA: Diagnosis not present

## 2018-11-06 DIAGNOSIS — I5022 Chronic systolic (congestive) heart failure: Secondary | ICD-10-CM | POA: Diagnosis not present

## 2018-11-06 DIAGNOSIS — E782 Mixed hyperlipidemia: Secondary | ICD-10-CM | POA: Diagnosis not present

## 2018-11-06 DIAGNOSIS — R0602 Shortness of breath: Secondary | ICD-10-CM | POA: Diagnosis not present

## 2018-11-07 ENCOUNTER — Ambulatory Visit: Payer: Self-pay | Admitting: Licensed Clinical Social Worker

## 2018-11-07 ENCOUNTER — Other Ambulatory Visit: Payer: Self-pay

## 2018-11-07 ENCOUNTER — Ambulatory Visit (INDEPENDENT_AMBULATORY_CARE_PROVIDER_SITE_OTHER): Payer: Medicare HMO | Admitting: Pharmacist

## 2018-11-07 DIAGNOSIS — J449 Chronic obstructive pulmonary disease, unspecified: Secondary | ICD-10-CM

## 2018-11-07 DIAGNOSIS — I509 Heart failure, unspecified: Secondary | ICD-10-CM

## 2018-11-07 DIAGNOSIS — R0602 Shortness of breath: Secondary | ICD-10-CM

## 2018-11-07 NOTE — Patient Instructions (Signed)
Visit Information  Goals Addressed            This Visit's Progress     Patient Stated   . COMPLETED: "I can't afford my inhalers" (pt-stated)       Current Barriers:  . Financial Barriers- patient notes that he cannot afford inhalers;  . Patient APPROVED for [employer] assistance for [employer] and Proventil through 06/11/2019. Patient has received both inhalers correctly as prescribed.  o Notes improvement in breathing with daily Dulera, though still requires Proventil ~4 times daily  Pharmacist Clinical Goal(s):  [name] Over the next 30 days, patient will work with PharmD to address needs related to inhaler medication access  Interventions:  Patient educated on refill process for [employer]. Encouraged to continue to take maintenance inhaler as prescribed.   In the future, will work with patient on tobacco cessation/reduction  Patient Self Care Activities:  . Self administers medications as prescribed . Calls provider office for new concerns or questions   Please see past updates related to this goal by clicking on the "Past Updates" button in the selected goal      . "I want to take care of my heart" (pt-stated)       Current Barriers:  [name] Knowledge Deficits related to management of HF - new diagnosis on Care Everywhere per [facility] - HFrEF, EF 20%; stress testing showed potentially previous infarct  o Reports cardiology started beta blocker- carvedilol 3.125 mg BID . Patient notes that he increased furosemide to 80 mg daily, has been taking 2 40 mg tabs QAM, but isn't sure if he is supposed to be taking 1 tab BID o Endorses "cramping" that has worsened since dose increase of furosemide; he is treating by eating mustard multiple times daily o Notes that he feels like his fluid status is improved, but that he still has fluid to get off.   Pharmacist Clinical Goal(s):  [name] Over the next 90 days, patient will work with PharmD and primary care team to address needs related to optimized  heart failure management  Interventions: . Comprehensive medication review performed.  . Educated patient on HFrEF, fluid build up, MoA of furosemide and carvedilol and long term benefits. He asked if carvedilol would be a long term medication; I explained that this was likely d/t dx HFrEF. Encouraged adherence . Explained to patient that it would be most preferred to take furosemide 80 mg QAM, or 40 mg QAM and 40 ~6 hours later, whichever he feels works best, but to not take furosemide in the evening to prevent noctural diuresis and interrupted sleep . Explained to patient that diuretics may cause him to have lower potassium levels, and that we would keep an eye on this in the future. No follow up is scheduled with Dr. Laural Benes at this time; will collaborate with her on determining when he needs to be seen next. Patient plans to eat a few bananas a week to see if this helps alleviate cramping  Patient Self Care Activities:  . Self administers medications as prescribed . Calls provider office for new concerns or questions  Initial goal documentation        The patient verbalized understanding of instructions provided today and declined a print copy of patient instruction materials.   Plan:  - Patient noted that his next cardiology appointment is 6/15; have scheduled a follow up call to reinforce any medication adjustments on 6/17. Will plan to continue to provide HF education and will collaborate with CCM team  for support  Catie Feliz Beamravis, PharmD Clinical Pharmacist [facility] [phone]

## 2018-11-07 NOTE — Chronic Care Management (AMB) (Signed)
  Chronic Care Management    Clinical Social Work General Note  11/07/2018 Name: Chad Gibbs MRN: [id number] DOB: 04/01/1966  Chad Gibbs is a 53 y.o. year old male who is a primary care patient of Dorcas Carrow, DO. The CCM was consulted to assist the patient with Community Resources related to housing.   Review of patient status, including review of consultants reports, relevant laboratory and other test results, and collaboration with appropriate care team members and the patient's provider was performed as part of comprehensive patient evaluation and provision of chronic care management services.    Goals Addressed    . "I need safe and stable housing" (pt-stated)       Current Barriers:  . Financial constraints . Housing barriers . ADL IADL limitations . Lacks knowledge of community resource: available housing resources within the area that may improve patient's living situation  Clinical Social Work Clinical Goal(s):  [name] Over the next 90 days, client will work with SW to address concerns related to lack of stable/safe housing . Over the next 90 days, client will follow up with housing resoures that were mailed out to patient (Additional housing resources mailed out on 10/27/2018 as well)* as directed by SW  Interventions: . Patient interviewed and appropriate assessments performed . Provided patient with information about financial education resources as well as housing support resources . Provided emotional support and reflective listening to family as they explained their living situation. Family own their mobile home which is no longer safe to reside in and they have made the decision that they need to relocate and sell this mobile house. Family are open to gaining new housing (rent or buy) and may need to relocate before they are able to sell their mobile home.   . Discussed plans with patient for ongoing care management follow up and provided patient with direct contact  information for care management team . Advised patient to check mailbox for additional community resources that were mailed out to him on 10/27/2018. Update-Patient reports not receiving resources yet. LCSW will re-send these resources on 11/10/2018. Patient appreciative of this.  . Assisted patient/caregiver with obtaining information about health plan benefits  Patient Self Care Activities:  . Attends all scheduled provider appointments . Calls provider office for new concerns or questions  Please see past updates related to this goal by clicking on the "Past Updates" button in the selected goal    Follow Up Plan: SW will follow up with patient by phone over the next 2-4 weeks      Dickie La, BSW, MSW, LCSW [facility] Care [facility] [place]  [facility] [email or web address] Phone: [phone]

## 2018-11-07 NOTE — Chronic Care Management (AMB) (Signed)
Chronic Care Management   Follow Up Note   11/07/2018 Name: Chad Gibbs MRN: [id number] DOB: 01-10-1966  Referred by: Dorcas Carrow, DO Reason for referral : Chronic Care Management (Medication Management)   Chad Gibbs is a 53 y.o. year old male who is a primary care patient of Dorcas Carrow, DO. The CCM team was consulted for assistance with chronic disease management and care coordination needs.    Contacted patient to discuss medication management and results of ECHO last week.   Review of patient status, including review of consultants reports, relevant laboratory and other test results, and collaboration with appropriate care team members and the patient's provider was performed as part of comprehensive patient evaluation and provision of chronic care management services.    Goals Addressed            This Visit's Progress     Patient Stated   . COMPLETED: "I can't afford my inhalers" (pt-stated)       Current Barriers:  . Financial Barriers- patient notes that he cannot afford inhalers;  . Patient APPROVED for [employer] assistance for [employer] and Proventil through 06/11/2019. Patient has received both inhalers correctly as prescribed.  o Notes improvement in breathing with daily Dulera, though still requires Proventil ~4 times daily  Pharmacist Clinical Goal(s):  [name] Over the next 30 days, patient will work with PharmD to address needs related to inhaler medication access  Interventions:  Patient educated on refill process for [employer]. Encouraged to continue to take maintenance inhaler as prescribed.   In the future, will work with patient on tobacco cessation/reduction  Patient Self Care Activities:  . Self administers medications as prescribed . Calls provider office for new concerns or questions   Please see past updates related to this goal by clicking on the "Past Updates" button in the selected goal      . "I want to take care of my heart"  (pt-stated)       Current Barriers:  [name] Knowledge Deficits related to management of HF - new diagnosis on Care Everywhere per [facility] Cardiology - HFrEF, EF 20%; stress testing showed potentially previous infarct  o Reports cardiology started beta blocker- carvedilol 3.125 mg BID . Patient notes that he increased furosemide to 80 mg daily, has been taking 2 40 mg tabs QAM, but isn't sure if he is supposed to be taking 1 tab BID o Endorses "cramping" that has worsened since dose increase of furosemide; he is treating by eating mustard multiple times daily o Notes that he feels like his fluid status is improved, but that he still has fluid to get off.   Pharmacist Clinical Goal(s):  [name] Over the next 90 days, patient will work with PharmD and primary care team to address needs related to optimized heart failure management  Interventions: . Comprehensive medication review performed.  . Educated patient on HFrEF, fluid build up, MoA of furosemide and carvedilol and long term benefits. He asked if carvedilol would be a long term medication; I explained that this was likely d/t dx HFrEF. Encouraged adherence . Explained to patient that it would be most preferred to take furosemide 80 mg QAM, or 40 mg QAM and 40 ~6 hours later, whichever he feels works best, but to not take furosemide in the evening to prevent noctural diuresis and interrupted sleep . Explained to patient that diuretics may cause him to have lower potassium levels, and that we would keep an eye on this in the  future. No follow up is scheduled with Dr. Laural BenesJohnson at this time; will collaborate with her on determining when he needs to be seen next. Patient plans to eat a few bananas a week to see if this helps alleviate cramping  Patient Self Care Activities:  . Self administers medications as prescribed . Calls provider office for new concerns or questions  Initial goal documentation        Plan:  - Patient noted that his next cardiology  appointment is 6/15; have scheduled a follow up call to reinforce any medication adjustments on 6/17. Will plan to continue to provide HF education and will collaborate with CCM team for support  Catie Feliz Beamravis, PharmD Clinical Pharmacist [facility] [phone]

## 2018-11-11 ENCOUNTER — Ambulatory Visit: Payer: Self-pay | Admitting: *Deleted

## 2018-11-11 ENCOUNTER — Telehealth: Payer: Self-pay

## 2018-11-11 DIAGNOSIS — J449 Chronic obstructive pulmonary disease, unspecified: Secondary | ICD-10-CM

## 2018-11-11 DIAGNOSIS — I509 Heart failure, unspecified: Secondary | ICD-10-CM

## 2018-11-11 NOTE — Chronic Care Management (AMB) (Signed)
  Chronic Care Management   Outreach Note  11/11/2018 Name: Chad Gibbs MRN: [id number] DOB: [birth year]  Referred by: Dorcas Carrow, DO Reason for referral : Chronic Care Management (HF, COPD, Referral by Catie)   An unsuccessful telephone outreach was attempted today. The patient was referred to the case management team by for assistance with chronic care management and care coordination.   Follow Up Plan: A HIPPA compliant phone message was left for the patient providing contact information and requesting a return call.  The care management team will reach out to the patient again over the next 14 days.   Ma Rings Lamar Meter RN, BSN Nurse Case Health and safety inspector [facility]  [phone]) Business Mobile

## 2018-11-12 ENCOUNTER — Ambulatory Visit: Payer: Self-pay | Admitting: *Deleted

## 2018-11-12 DIAGNOSIS — E6609 Other obesity due to excess calories: Secondary | ICD-10-CM

## 2018-11-12 DIAGNOSIS — I509 Heart failure, unspecified: Secondary | ICD-10-CM

## 2018-11-13 NOTE — Chronic Care Management (AMB) (Signed)
  Chronic Care Management   Follow Up Note   11/13/2018 Name: Chad Gibbs MRN: [id number] DOB: 02/08/1966  Referred by: Dorcas Carrow, DO Reason for referral : Chronic Care Management (HF)   Chad Gibbs is a 53 y.o. year old male who is a primary care patient of Dorcas Carrow, DO. The CCM team was consulted for assistance with chronic disease management and care coordination needs.    Review of patient status, including review of consultants reports, relevant laboratory and other test results, and collaboration with appropriate care team members and the patient's provider was performed as part of comprehensive patient evaluation and provision of chronic care management services.    Goals Addressed            This Visit's Progress   . I need to know what to eat for my Heart (pt-stated)       Current Barriers:  [name] Knowledge deficit related to basic heart failure pathophysiology and self care management . Financial strain  Case Manager Clinical Goal(s):  [name] Over the next 30 days, patient will weigh self daily and record  . Over the next 90 days patient will verbalize understanding of how to read labels to adhere to a low sodium diet  Interventions:  . Provided verbal education on low sodium diet . Assessed need for readable accurate scales in home . Advised patient to weigh each morning after emptying bladder . Discussed importance of daily weight and advised patient to weigh and record daily  Patient Self Care Activities:  . Takes Heart Failure Medications as prescribed  Initial goal documentation        The care management team will reach out to the patient again over the next 14 days.  The patient has been provided with contact information for the care management team and has been advised to call with any health related questions or concerns.   Ma Rings Jiana Lemaire RN, BSN Nurse Case Education officer, community [facility]  [phone]) Business  Mobile

## 2018-11-13 NOTE — Patient Instructions (Signed)
Thank you allowing the Chronic Care Management Team to be a part of your care! It was a pleasure speaking with you today!  CCM (Chronic Care Management) Team   Reyli Schroth RN, BSN Nurse Care Coordinator  [phone]  Catie [facility] PharmD  Clinical Pharmacist  [phone]  Dickie La LCSW Clinical Social Worker [phone]  Goals Addressed            This Visit's Progress   . I need to know what to eat for my Heart (pt-stated)       Current Barriers:  [name] Knowledge deficit related to basic heart failure pathophysiology and self care management . Financial strain  Case Manager Clinical Goal(s):  [name] Over the next 30 days, patient will weigh self daily and record  . Over the next 90 days patient will verbalize understanding of how to read labels to adhere to a low sodium diet  Interventions:  . Provided verbal education on low sodium diet . Assessed need for readable accurate scales in home . Advised patient to weigh each morning after emptying bladder . Discussed importance of daily weight and advised patient to weigh and record daily  Patient Self Care Activities:  . Takes Heart Failure Medications as prescribed  Initial goal documentation       The patient verbalized understanding of instructions provided today and declined a print copy of patient instruction materials.   The care management team will reach out to the patient again over the next 14 days.  The patient has been provided with contact information for the care management team and has been advised to call with any health related questions or concerns.

## 2018-11-21 ENCOUNTER — Ambulatory Visit (INDEPENDENT_AMBULATORY_CARE_PROVIDER_SITE_OTHER): Payer: Medicare HMO | Admitting: Licensed Clinical Social Worker

## 2018-11-21 ENCOUNTER — Other Ambulatory Visit: Payer: Self-pay

## 2018-11-21 DIAGNOSIS — J449 Chronic obstructive pulmonary disease, unspecified: Secondary | ICD-10-CM

## 2018-11-21 DIAGNOSIS — I509 Heart failure, unspecified: Secondary | ICD-10-CM | POA: Diagnosis not present

## 2018-11-21 DIAGNOSIS — R0602 Shortness of breath: Secondary | ICD-10-CM

## 2018-11-21 NOTE — Chronic Care Management (AMB) (Addendum)
  Chronic Care Management    Clinical Social Work Follow Up Note  11/21/2018 Name: KAMAAL CAST MRN: [id number] DOB: 01/11/1966  [name] is a 53 y.o. year old male who is a primary care patient of Valerie Roys, DO. The CCM team was consulted for assistance with housing support/relocating/selling current home.  Review of patient status, including review of consultants reports, other relevant assessments, and collaboration with appropriate care team members and the patient's provider was performed as part of comprehensive patient evaluation and provision of chronic care management services.     Goals Addressed    . "I need safe and stable housing" (pt-stated)       Current Barriers:  . Financial constraints . Housing barriers . ADL IADL limitations . Lacks knowledge of community resource: available housing resources within the area that may improve patient's living situation  Clinical Social Work Clinical Goal(s):  [name] Over the next 90 days, client will work with SW to address concerns related to lack of stable/safe housing . Over the next 90 days, client will follow up with housing resoures that were mailed out to patient (Additional housing resources mailed out on 10/27/2018 as well)* as directed by SW  Interventions: . Patient interviewed and appropriate assessments performed . Provided patient with information about financial education resources as well as housing support resources . Provided emotional support and reflective listening to family as they explained their living situation. Family own their mobile home which is no longer safe to reside in and they have made the decision that they need to relocate and sell this mobile house. Family are open to gaining new housing (rent or buy) and may need to relocate before they are able to sell their mobile home.   . Discussed plans with patient for ongoing care management follow up and provided patient with direct contact  information for care management team . Advised patient to check mailbox for additional community resources that were mailed out to him on 10/27/2018. Patient reports not receiving resources yet. LCSW will re-send these resources on 11/10/2018. Patient appreciative of this. Patient confirms receiving these resources but has not reviewed resources or made calls to investigate/find an affordable real estate agent to help family sell home and relocate to a safer residence. . Assisted patient/caregiver with obtaining information about health plan benefits  Patient Self Care Activities:  . Attends all scheduled provider appointments . Calls provider office for new concerns or questions  Please see past updates related to this goal by clicking on the "Past Updates" button in the selected goal        Follow Up Plan: SW will follow up with patient by phone over the next month  Eula Fried, [place], MSW, [email or web address] Phone: [phone]

## 2018-11-26 ENCOUNTER — Ambulatory Visit: Payer: Self-pay | Admitting: Pharmacist

## 2018-11-26 ENCOUNTER — Ambulatory Visit: Payer: Self-pay | Admitting: *Deleted

## 2018-11-26 DIAGNOSIS — R0602 Shortness of breath: Secondary | ICD-10-CM

## 2018-11-26 DIAGNOSIS — E6609 Other obesity due to excess calories: Secondary | ICD-10-CM

## 2018-11-26 DIAGNOSIS — J449 Chronic obstructive pulmonary disease, unspecified: Secondary | ICD-10-CM | POA: Diagnosis not present

## 2018-11-26 DIAGNOSIS — Z6835 Body mass index (BMI) 35.0-35.9, adult: Secondary | ICD-10-CM

## 2018-11-26 DIAGNOSIS — I509 Heart failure, unspecified: Secondary | ICD-10-CM | POA: Diagnosis not present

## 2018-11-26 NOTE — Patient Instructions (Signed)
Thank you allowing the Chronic Care Management Team to be a part of your care! It was a pleasure speaking with you today!   CCM (Chronic Care Management) Team   Ardith Lewman RN, BSN Nurse Care Coordinator  [phone]  Catie [facility] PharmD  Clinical Pharmacist  [phone]  Eula Fried LCSW Clinical Social Worker [phone]  Goals Addressed            This Visit's Progress   . I need to know what to eat for my Heart (pt-stated)       Current Barriers:  [name] Knowledge deficit related to basic heart failure pathophysiology and self care management . Financial strain  Case Manager Clinical Goal(s):  [name] Over the next 30 days, patient will weigh self daily and record  . Over the next 90 days patient will verbalize understanding of how to read labels to adhere to a low sodium diet  Interventions:  . Basic overview and discussion of pathophysiology of Heart Failure reviewed  . Provided verbal education on low sodium diet . Provided written education on low sodium diet . Reviewed Heart Failure Action Plan in depth and provided written copy . Assessed need for readable accurate scales in home . Advised patient to weigh each morning after emptying bladder . Discussed importance of daily weight and advised patient to weigh and record daily . Reviewed role of diuretics in prevention of fluid overload and management of heart failure . Reviewed low sodium diet . Reviewed weights- patient stating weight is 264lbs this am down from 271lbs 2 weeks ago.  . Discussed the importance of exercise . Plan to mail calendar to assist with remembering appointments related to patient stating his memory is poor after a previous accident. Chad Gibbs he missed his cardiology appointment recently but was able to reschedule  for June 26 11:45am   Patient Self Care Activities:  . Takes Heart Failure Medications as prescribed  Please see past updates related to this goal by clicking on the "Past  Updates" button in the selected goal        The patient verbalized understanding of instructions provided today and declined a print copy of patient instruction materials.   The patient has been provided with contact information for the care management team and has been advised to call with any health related questions or concerns.

## 2018-11-26 NOTE — Patient Instructions (Signed)
Visit Information  Goals Addressed            This Visit's Progress     Patient Stated   . "I want to quit smoking" (pt-stated)       Current Barriers:  . Patient with 30+ year hx of tobacco abuse, currently smoking 4-5 cigarettes daily.  . Notes that he has never tried NRT consistently, has used gum occasionally with little success . Currently trying to focus on stress reduction as a strategy   Pharmacist Clinical Goal(s):  [name] Over the next 90 days, patient will work with PharmD to address needs related to medication management for smoking cessation  Interventions: . Discussed benefit of NRT; as he is only smoking 1/4 ppd, patch therapy would be too much nicotine. Recommend using nicotine gum, chewing 1 piece instead of a cigarette whenever he has cravings. Can use up to 5-6 pieces daily. Will collaborate with PCP Dr. Wynetta Emery to send nicotine 2 mg gum to pharmacy.  . Counseled on chew and park method   Patient Self Care Activities:  . Self administers medications as prescribed . Calls provider office for new concerns or questions  Initial goal documentation     . "I want to take care of my heart" (pt-stated)       Current Barriers:  [name] Knowledge Deficits related to management of HF  - new diagnosis on Care Everywhere per [facility] - HFrEF, EF 20-25%; stress testing showed potentially previous infarct  o Treated currently with carvedilol 3.125 mg BID and furosemide 80 mg daily o Notes that he missed his cardiology appointment this week, but rescheduled for 12/05/2018 o Notes he is taking furosemide 40 mg and carvedilol 3.125 mg at 8 am and at 2 pm o Reports weight this morning of 264 lbs  Pharmacist Clinical Goal(s):  [name] Over the next 90 days, patient will work with PharmD and primary care team to address needs related to optimized heart failure management  Interventions: . Extensive discussion about heart failure diet  . Extensive discussion regarding daily  weighing . Discussed that taking furosemide 40 mg at 8 and 2 pm is perfect; recommended he move the second carvedilol dose to closer to 8 pm, after supper. He verbalized understanding . Extensive discussion regarding HF medication MoA and benefit on contractility.  . Will mail patient a TID pill box - AM - furosemide, carvedilol; afternoon - furosemide; evening - carvedilol. This will set patient up to be able to add other cardiovascular medications in the future.   Patient Self Care Activities:  . Self administers medications as prescribed . Calls provider office for new concerns or questions  Please see past updates related to this goal by clicking on the "Past Updates" button in the selected goal         The patient verbalized understanding of instructions provided today and declined a print copy of patient instruction materials.   Plan:  - PharmD will call patient in 2 weeks to f/u on changes made at cardiology appointment  Catie Darnelle Maffucci, PharmD Clinical Pharmacist [place] [phone]

## 2018-11-26 NOTE — Chronic Care Management (AMB) (Signed)
Chronic Care Management   Follow Up Note   11/26/2018 Name: Chad Gibbs MRN: [id number] DOB: 1966/01/17  Referred by: Dorcas CarrowJohnson, Megan P, DO Reason for referral : Chronic Care Management (Medication Management)   Chad Gibbs is a 53 y.o. year old male who is a primary care patient of Dorcas CarrowJohnson, Megan P, DO. The CCM team was consulted for assistance with chronic disease management and care coordination needs.    Spoke with patient today with Chad Minor, RN CM.   Review of patient status, including review of consultants reports, relevant laboratory and other test results, and collaboration with appropriate care team members and the patient's provider was performed as part of comprehensive patient evaluation and provision of chronic care management services.    Goals Addressed            This Visit's Progress     Patient Stated   . "I want to quit smoking" (pt-stated)       Current Barriers:  . Patient with 30+ year hx of tobacco abuse, currently smoking 4-5 cigarettes daily.  . Notes that he has never tried NRT consistently, has used gum occasionally with little success . Currently trying to focus on stress reduction as a strategy   Pharmacist Clinical Goal(s):  [name]. Over the next 90 days, patient will work with PharmD to address needs related to medication management for smoking cessation  Interventions: . Discussed benefit of NRT; as he is only smoking 1/4 ppd, patch therapy would be too much nicotine. Recommend using nicotine gum, chewing 1 piece instead of a cigarette whenever he has cravings. Can use up to 5-6 pieces daily. Will collaborate with PCP Dr. Laural BenesJohnson to send nicotine 2 mg gum to pharmacy.  . Counseled on chew and park method   Patient Self Care Activities:  . Self administers medications as prescribed . Calls provider office for new concerns or questions  Initial goal documentation     . "I want to take care of my heart" (pt-stated)       Current  Barriers:  [name]. Knowledge Deficits related to management of HF  - [facility] - HFrEF, EF 20-25%; stress testing showed potentially previous infarct  o Treated currently with carvedilol 3.125 mg BID and furosemide 80 mg daily o Notes that he missed his cardiology appointment this week, but rescheduled for 12/05/2018 o Notes he is taking furosemide 40 mg and carvedilol 3.125 mg at 8 am and at 2 pm o Reports weight this morning of 264 lbs  Pharmacist Clinical Goal(s):  [name]. Over the next 90 days, patient will work with PharmD and primary care team to address needs related to optimized heart failure management  Interventions: . Extensive discussion about heart failure diet  . Extensive discussion regarding daily weighing . Discussed that taking furosemide 40 mg at 8 and 2 pm is perfect; recommended he move the second carvedilol dose to closer to 8 pm, after supper. He verbalized understanding . Extensive discussion regarding HF medication MoA and benefit on contractility.  . Will mail patient a TID pill box - AM - furosemide, carvedilol; afternoon - furosemide; evening - carvedilol. This will set patient up to be able to add other cardiovascular medications in the future.   Patient Self Care Activities:  . Self administers medications as prescribed . Calls provider office for new concerns or questions  Please see past updates related to this goal by clicking on the "Past Updates" button in  the selected goal          Plan:  - PharmD will call patient in 2 weeks to f/u on changes made at cardiology appointment  Catie Darnelle Maffucci, PharmD Clinical Pharmacist [place] [phone]

## 2018-11-26 NOTE — Chronic Care Management (AMB) (Signed)
  Chronic Care Management   Follow Up Note   11/26/2018 Name: JENNINGS CORADO MRN: [id number] DOB: 05-03-1966  Referred by: Valerie Roys, DO Reason for referral : Chronic Care Management (HF education)   FURQAN GOSSELIN is a 53 y.o. year old male who is a primary care patient of Valerie Roys, DO. The CCM team was consulted for assistance with chronic disease management and care coordination needs.    Review of patient status, including review of consultants reports, relevant laboratory and other test results, and collaboration with appropriate care team members and the patient's provider was performed as part of comprehensive patient evaluation and provision of chronic care management services.    Goals Addressed            This Visit's Progress   . I need to know what to eat for my Heart (pt-stated)       Current Barriers:  [name] Knowledge deficit related to basic heart failure pathophysiology and self care management . Financial strain  Case Manager Clinical Goal(s):  [name] Over the next 30 days, patient will weigh self daily and record  . Over the next 90 days patient will verbalize understanding of how to read labels to adhere to a low sodium diet  Interventions:  . Basic overview and discussion of pathophysiology of Heart Failure reviewed  . Provided verbal education on low sodium diet . Provided written education on low sodium diet . Reviewed Heart Failure Action Plan in depth and provided written copy . Assessed need for readable accurate scales in home . Advised patient to weigh each morning after emptying bladder . Discussed importance of daily weight and advised patient to weigh and record daily . Reviewed role of diuretics in prevention of fluid overload and management of heart failure . Reviewed low sodium diet . Reviewed weights- patient stating weight is 264lbs this am down from 271lbs 2 weeks ago.  . Discussed the importance of exercise . Plan to mail calendar  to assist with remembering appointments related to patient stating his memory is poor after a previous accident. Edison Simon he missed his cardiology appointment recently but was able to reschedule  for June 26 11:45am   Patient Self Care Activities:  . Takes Heart Failure Medications as prescribed  Please see past updates related to this goal by clicking on the "Past Updates" button in the selected goal         The care management team will reach out to the patient again over the next 21 days.  The patient has been provided with contact information for the care management team and has been advised to call with any health related questions or concerns.   Merlene Morse Milcah Dulany RN, BSN Nurse Case Editor, [facility] Care Management  [phone]) Business [employer]

## 2018-12-02 ENCOUNTER — Ambulatory Visit: Payer: Self-pay | Admitting: Pharmacist

## 2018-12-02 DIAGNOSIS — I509 Heart failure, unspecified: Secondary | ICD-10-CM

## 2018-12-02 NOTE — Chronic Care Management (AMB) (Signed)
  Chronic Care Management   Note  12/02/2018 Name: Chad Gibbs MRN: [id number] DOB: 03/05/1966  [name] is a 53 y.o. year old male who is a primary care patient of Valerie Roys, DO. The CCM team was consulted for assistance with chronic disease management and care coordination needs.    Received call from patient. He received his pill box, calendar/weight recording book, and dietary information that Janci Minor, RN CM and I sent him. He expressed appreciation.   We discussed continuing to weigh daily and recording. We discussed weighing before his cardiology appointment on Friday to be able to compare the office readings vs home readings. He verbalized understanding  Follow up plan: - PharmD will contact patient next week to follow up on medication changes made at upcoming cardiology appointment  Catie Darnelle Maffucci, PharmD Clinical Pharmacist [place] [phone]

## 2018-12-05 DIAGNOSIS — I2510 Atherosclerotic heart disease of native coronary artery without angina pectoris: Secondary | ICD-10-CM | POA: Diagnosis not present

## 2018-12-05 DIAGNOSIS — R600 Localized edema: Secondary | ICD-10-CM | POA: Diagnosis not present

## 2018-12-05 DIAGNOSIS — I5022 Chronic systolic (congestive) heart failure: Secondary | ICD-10-CM | POA: Diagnosis not present

## 2018-12-05 DIAGNOSIS — I350 Nonrheumatic aortic (valve) stenosis: Secondary | ICD-10-CM | POA: Diagnosis not present

## 2018-12-08 ENCOUNTER — Other Ambulatory Visit: Payer: Self-pay | Admitting: Family Medicine

## 2018-12-08 ENCOUNTER — Other Ambulatory Visit: Payer: Self-pay

## 2018-12-08 MED ORDER — FUROSEMIDE 40 MG PO TABS: 80.0000 mg | ORAL_TABLET | Freq: Every day | ORAL | 0 refills | Status: DC

## 2018-12-08 MED ORDER — FUROSEMIDE 40 MG PO TABS: 40.0000 mg | ORAL_TABLET | Freq: Every day | ORAL | 1 refills | Status: DC

## 2018-12-08 NOTE — Telephone Encounter (Signed)
Per Dr. Alveria Apley last appointment- should be taking 40mg  of lasix daily, not 80- please confirm dose with pharmacy.

## 2018-12-08 NOTE — Telephone Encounter (Signed)
Patient was seen Friday 12/05/2018 and has an appointment 01/07/2019

## 2018-12-08 NOTE — Telephone Encounter (Signed)
Looks like he's 2 weeks late for a follow up with Dr. Nehemiah Massed- can we please see about getting him in to see him? Thanks!

## 2018-12-10 ENCOUNTER — Ambulatory Visit: Payer: Self-pay | Admitting: Licensed Clinical Social Worker

## 2018-12-10 ENCOUNTER — Ambulatory Visit (INDEPENDENT_AMBULATORY_CARE_PROVIDER_SITE_OTHER): Payer: Medicare HMO | Admitting: *Deleted

## 2018-12-10 ENCOUNTER — Ambulatory Visit: Payer: Self-pay | Admitting: Pharmacist

## 2018-12-10 ENCOUNTER — Other Ambulatory Visit: Payer: Self-pay

## 2018-12-10 DIAGNOSIS — F411 Generalized anxiety disorder: Secondary | ICD-10-CM

## 2018-12-10 DIAGNOSIS — I509 Heart failure, unspecified: Secondary | ICD-10-CM | POA: Diagnosis not present

## 2018-12-10 NOTE — Chronic Care Management (AMB) (Signed)
  Chronic Care Management    Clinical Social Work Follow Up Note  12/10/2018 Name: Chad Gibbs MRN: [id number] DOB: 1965-08-30  Chad Gibbs is a 53 y.o. year old male who is a primary care patient of Chad Roys, DO. The CCM team was consulted for assistance with [employer].   Review of patient status, including review of consultants reports, other relevant assessments, and collaboration with appropriate care team members and the patient's provider was performed as part of comprehensive patient evaluation and provision of chronic care management services.     Goals Addressed    . "I need safe and stable housing" (pt-stated)       Current Barriers:  . Financial constraints . Housing barriers . ADL IADL limitations . Lacks knowledge of community resource: available housing resources within the area that may improve patient's living situation  Clinical Social Work Clinical Goal(s):  [name] Over the next 90 days, client will work with SW to address concerns related to lack of stable/safe housing . Over the next 90 days, client will follow up with housing resoures that were mailed out to patient (Additional housing resources mailed out on 10/27/2018 as well)* as directed by SW  Interventions: . Patient interviewed and appropriate assessments performed . Provided patient with information about financial education resources as well as housing support resources . Provided emotional support and reflective listening to family as they explained their living situation. Family own their mobile home which is no longer safe to reside in and they have made the decision that they need to relocate and sell this mobile house. Family are open to gaining new housing (rent or buy) and may need to relocate before they are able to sell their mobile home.   . Discussed plans with patient for ongoing care management follow up and provided patient with direct contact information for care management  team . Advised patient to check mailbox for additional community resources that were mailed out to him on 10/27/2018. Patient reports not receiving resources yet. LCSW will re-send these resources on 11/10/2018. Patient appreciative of this. Updates-Patient confirms receiving these resources but has not reviewed resources or made calls to investigate/find an affordable real estate agent to help family sell home and relocate to a safer residence. . CSW used motivational interviewing techniques to engage pt in meaningful conversation and to assess pt's wilingess to relocate and sell their residence. CSW explored pt's options again. Patient confirms to have resources in a safe place for him to revert back to.  . Assisted patient/caregiver with obtaining information about health plan benefits  Patient Self Care Activities:  . Attends all scheduled provider appointments . Calls provider office for new concerns or questions  Please see past updates related to this goal by clicking on the "Past Updates" button in the selected goal    Follow Up Plan: SW will follow up with patient by phone next month.   Eula Fried, BSW, MSW, [facility] Care Management [email or web address] Phone: [phone]

## 2018-12-10 NOTE — Chronic Care Management (AMB) (Signed)
  Chronic Care Management   Note  12/10/2018 Name: Chad Gibbs MRN: [id number] DOB: 23-Jul-1965  Chad Gibbs is a 53 y.o. year old male who is a primary care patient of Valerie Roys, DO. The CCM team was consulted for assistance with chronic disease management and care coordination needs.    Contacted patient to follow up on medication changes made at recent cardiology appointment. Spoke with his wife, and noted that he would call us back later. He did not call back before end of business.    Follow up plan: - Will outreach patient again within 2-3 weeks for continued medication management support  Catie Darnelle Maffucci, PharmD Clinical Pharmacist [place] [phone]

## 2018-12-11 NOTE — Chronic Care Management (AMB) (Signed)
  Chronic Care Management   Outreach Note  12/11/2018 Name: Chad Gibbs MRN: [id number] DOB: 01-Jun-1966  Referred by: Valerie Roys, DO Reason for referral : Chronic Care Management (Unsuccessful attempt )   An unsuccessful telephone outreach was attempted today. The patient was referred to the case management team by for assistance with chronic care management and care coordination. Patient's phone was answered by a male, she stated the patient was busy right now.   Follow Up Plan: The care management team will reach out to the patient again over the next 30 days.  The patient has been provided with contact information for the care management team and has been advised to call with any health related questions or concerns.   Merlene Morse Maram Bently RN, BSN Nurse Case Editor, [facility] Care Management  [phone]) Business [employer]

## 2018-12-17 ENCOUNTER — Telehealth: Payer: Self-pay

## 2018-12-29 ENCOUNTER — Ambulatory Visit: Payer: Self-pay | Admitting: Family Medicine

## 2018-12-29 NOTE — Telephone Encounter (Signed)
Pt reports left sided chest pain, "At heart area" and SOB. Onset 1 week ago. States SOB at rest and with minimal exertion "Can't even walk, can't sleep."  States pain is intermittent ,10/10 when occurs, "But mostly constant. Reports diarrhea, nausea, no vomiting, dizziness dry cough wheezing. Speech halting during call. States has appt with cardiologist 01/07/2019. Requesting Dr. Wynetta Emery call in something for "This respiratory infection."  Asked pt if he had been around anyone with known positive covid, states "Probably." Advised pt go to ED, declines, "I'll ride it out."  Reiterated need for ED disposition declines. Advised to call cardiologist to report symptoms, states will do so. Again recommended ED, states "I'm not a loser."  Assured pt [place] would send to practice to alert Dr. Wynetta Emery.  Pts CB# [phone] Reason for Disposition . Difficulty breathing  Answer Assessment - Initial Assessment Questions 1. LOCATION: "Where does it hurt?"       Left side of chest 2. RADIATION: "Does the pain go anywhere else?" (e.g., into neck, jaw, arms, back)     no 3. ONSET: "When did the chest pain begin?" (Minutes, hours or days)     1 week ago 4. PATTERN "Does the pain come and go, or has it been constant since it started?"  "Does it get worse with exertion?"      intermittent 5. DURATION: "How long does it last" (e.g., seconds, minutes, hours)    Not sure 6. SEVERITY: "How bad is the pain?"  (e.g., Scale 1-10; mild, moderate, or severe)    - MILD (1-3): doesn't interfere with normal activities     - MODERATE (4-7): interferes with normal activities or awakens from sleep    - SEVERE (8-10): excruciating pain, unable to do any normal activities       10/10 7. CARDIAC RISK FACTORS: "Do you have any history of heart problems or risk factors for heart disease?" (e.g., angina, prior heart attack; diabetes, high blood pressure, high cholesterol, smoker, or strong family history of heart disease)     yes 8.  PULMONARY RISK FACTORS: "Do you have any history of lung disease?"  (e.g., blood clots in lung, asthma, emphysema, birth control pills)    yes 9. CAUSE: "What do you think is causing the chest pain?"     lungs 10. OTHER SYMPTOMS: "Do you have any other symptoms?" (e.g., dizziness, nausea, vomiting, sweating, fever, difficulty breathing, cough)     SOB, dizziness, diarrhea.  SOB at rest, wheezing, cough, nausea, no vomiting  Protocols used: CHEST PAIN-A-AH

## 2018-12-29 NOTE — Telephone Encounter (Signed)
If he calls back- please let him know that SOB and chest pain, I am afraid that he could be having a heart attack. I strongly advise him to go to the ER.

## 2018-12-29 NOTE — Telephone Encounter (Signed)
Called and left a detailed message letting patient know that he needs to go to the ER.

## 2018-12-29 NOTE — Telephone Encounter (Signed)
Needs to go to ER

## 2018-12-29 NOTE — Telephone Encounter (Signed)
Spoke with patient's wife, she states that she don't know if they will go to the ER, they have other things going on.  Informed patient wife that it was Dr.Johnson's recommendation to go to the ER to be evaluated.

## 2018-12-30 ENCOUNTER — Emergency Department: Payer: Medicare HMO

## 2018-12-30 ENCOUNTER — Encounter: Payer: Self-pay | Admitting: Intensive Care

## 2018-12-30 ENCOUNTER — Observation Stay
Admission: EM | Admit: 2018-12-30 | Discharge: 2018-12-31 | Disposition: A | Discharge status: Home / Self Care | Payer: Medicare HMO | Attending: Internal Medicine | Admitting: Internal Medicine

## 2018-12-30 ENCOUNTER — Other Ambulatory Visit: Payer: Self-pay

## 2018-12-30 DIAGNOSIS — I447 Left bundle-branch block, unspecified: Secondary | ICD-10-CM | POA: Diagnosis not present

## 2018-12-30 DIAGNOSIS — R0602 Shortness of breath: Principal | ICD-10-CM | POA: Insufficient documentation

## 2018-12-30 DIAGNOSIS — I5023 Acute on chronic systolic (congestive) heart failure: Secondary | ICD-10-CM | POA: Diagnosis not present

## 2018-12-30 DIAGNOSIS — Z720 Tobacco use: Secondary | ICD-10-CM | POA: Diagnosis not present

## 2018-12-30 DIAGNOSIS — I110 Hypertensive heart disease with heart failure: Secondary | ICD-10-CM | POA: Diagnosis not present

## 2018-12-30 DIAGNOSIS — R197 Diarrhea, unspecified: Secondary | ICD-10-CM | POA: Insufficient documentation

## 2018-12-30 DIAGNOSIS — Z6835 Body mass index (BMI) 35.0-35.9, adult: Secondary | ICD-10-CM | POA: Insufficient documentation

## 2018-12-30 DIAGNOSIS — Z8249 Family history of ischemic heart disease and other diseases of the circulatory system: Secondary | ICD-10-CM | POA: Diagnosis not present

## 2018-12-30 DIAGNOSIS — R69 Illness, unspecified: Secondary | ICD-10-CM | POA: Diagnosis not present

## 2018-12-30 DIAGNOSIS — E876 Hypokalemia: Secondary | ICD-10-CM | POA: Diagnosis not present

## 2018-12-30 DIAGNOSIS — F17210 Nicotine dependence, cigarettes, uncomplicated: Secondary | ICD-10-CM | POA: Diagnosis not present

## 2018-12-30 DIAGNOSIS — Z7951 Long term (current) use of inhaled steroids: Secondary | ICD-10-CM | POA: Insufficient documentation

## 2018-12-30 DIAGNOSIS — F411 Generalized anxiety disorder: Secondary | ICD-10-CM | POA: Diagnosis not present

## 2018-12-30 DIAGNOSIS — J441 Chronic obstructive pulmonary disease with (acute) exacerbation: Secondary | ICD-10-CM | POA: Diagnosis not present

## 2018-12-30 DIAGNOSIS — Z79899 Other long term (current) drug therapy: Secondary | ICD-10-CM | POA: Insufficient documentation

## 2018-12-30 DIAGNOSIS — Z1159 Encounter for screening for other viral diseases: Secondary | ICD-10-CM | POA: Diagnosis not present

## 2018-12-30 DIAGNOSIS — E669 Obesity, unspecified: Secondary | ICD-10-CM | POA: Insufficient documentation

## 2018-12-30 DIAGNOSIS — Z20828 Contact with and (suspected) exposure to other viral communicable diseases: Secondary | ICD-10-CM | POA: Diagnosis not present

## 2018-12-30 DIAGNOSIS — R079 Chest pain, unspecified: Secondary | ICD-10-CM | POA: Diagnosis not present

## 2018-12-30 LAB — BASIC METABOLIC PANEL
Anion gap: 11 (ref 5–15)
BUN: 18 mg/dL (ref 6–20)
CO2: 27 mmol/L (ref 22–32)
Calcium: 8.6 mg/dL — ABNORMAL LOW (ref 8.9–10.3)
Chloride: 98 mmol/L (ref 98–111)
Creatinine, Ser: 1.1 mg/dL (ref 0.61–1.24)
GFR calc Af Amer: >60 mL/min (ref >60)
GFR calc non Af Amer: >60 mL/min (ref >60)
Glucose, Bld: 118 mg/dL — ABNORMAL HIGH (ref 70–99)
Potassium: 3.2 mmol/L — ABNORMAL LOW (ref 3.5–5.1)
Sodium: 136 mmol/L (ref 135–145)

## 2018-12-30 LAB — CBC
HCT: 36.2 % — ABNORMAL LOW (ref 39.0–52.0)
Hemoglobin: 11.9 g/dL — ABNORMAL LOW (ref 13.0–17.0)
MCH: 26.9 pg (ref 26.0–34.0)
MCHC: 32.9 g/dL (ref 30.0–36.0)
MCV: 81.7 fL (ref 80.0–100.0)
Platelets: 265 10*3/uL (ref 150–400)
RBC: 4.43 MIL/uL (ref 4.22–5.81)
RDW: 17 % — ABNORMAL HIGH (ref 11.5–15.5)
WBC: 9.6 10*3/uL (ref 4.0–10.5)
nRBC: 0 % (ref 0.0–0.2)

## 2018-12-30 LAB — TROPONIN I (HIGH SENSITIVITY)
Troponin I (High Sensitivity): 46 ng/L — ABNORMAL HIGH (ref <18)
Troponin I (High Sensitivity): 46 ng/L — ABNORMAL HIGH (ref <18)

## 2018-12-30 LAB — SARS CORONAVIRUS 2 BY RT PCR (HOSPITAL ORDER, PERFORMED IN ~~LOC~~ HOSPITAL LAB): SARS Coronavirus 2: NEGATIVE

## 2018-12-30 MED ORDER — IPRATROPIUM-ALBUTEROL 0.5-2.5 (3) MG/3ML IN SOLN: 3.0000 mL | RESPIRATORY_TRACT | Status: DC | PRN

## 2018-12-30 MED ORDER — IPRATROPIUM-ALBUTEROL 0.5-2.5 (3) MG/3ML IN SOLN
3.0000 mL | Freq: Once | RESPIRATORY_TRACT | Status: AC
  Administered 2018-12-30: 3 mL via RESPIRATORY_TRACT

## 2018-12-30 MED ORDER — ENOXAPARIN SODIUM 40 MG/0.4ML ~~LOC~~ SOLN
40.0000 mg | SUBCUTANEOUS | Status: DC
  Administered 2018-12-31: 40 mg via SUBCUTANEOUS
  Filled 2018-12-30: qty 0.4

## 2018-12-30 MED ORDER — TRAZODONE HCL 50 MG PO TABS: 25.0000 mg | ORAL_TABLET | Freq: Every evening | ORAL | Status: DC | PRN

## 2018-12-30 MED ORDER — IPRATROPIUM-ALBUTEROL 0.5-2.5 (3) MG/3ML IN SOLN
3.0000 mL | Freq: Once | RESPIRATORY_TRACT | Status: AC
  Administered 2018-12-30: 3 mL via RESPIRATORY_TRACT
  Filled 2018-12-30: qty 9

## 2018-12-30 MED ORDER — SODIUM CHLORIDE 0.9 % IV SOLN
1.0000 g | INTRAVENOUS | Status: DC
  Administered 2018-12-31: 1 g via INTRAVENOUS
  Filled 2018-12-30: qty 1

## 2018-12-30 MED ORDER — FUROSEMIDE 40 MG PO TABS: 40.0000 mg | ORAL_TABLET | Freq: Every day | ORAL | Status: DC

## 2018-12-30 MED ORDER — ONDANSETRON HCL 4 MG/2ML IJ SOLN: 4.0000 mg | Freq: Four times a day (QID) | INTRAMUSCULAR | Status: DC | PRN

## 2018-12-30 MED ORDER — SODIUM CHLORIDE 0.9 % IV SOLN
INTRAVENOUS | Status: DC
  Administered 2018-12-31: 04:00:00 via INTRAVENOUS

## 2018-12-30 MED ORDER — PREDNISONE 20 MG PO TABS: 40.0000 mg | ORAL_TABLET | Freq: Every day | ORAL | Status: DC

## 2018-12-30 MED ORDER — IPRATROPIUM-ALBUTEROL 0.5-2.5 (3) MG/3ML IN SOLN
3.0000 mL | Freq: Four times a day (QID) | RESPIRATORY_TRACT | Status: DC
  Administered 2018-12-31 (×3): 3 mL via RESPIRATORY_TRACT
  Filled 2018-12-30 (×4): qty 3

## 2018-12-30 MED ORDER — METHYLPREDNISOLONE SODIUM SUCC 40 MG IJ SOLR
40.0000 mg | Freq: Four times a day (QID) | INTRAMUSCULAR | Status: DC
  Administered 2018-12-31 (×2): 40 mg via INTRAVENOUS
  Filled 2018-12-30 (×2): qty 1

## 2018-12-30 MED ORDER — CARVEDILOL 3.125 MG PO TABS
6.2500 mg | ORAL_TABLET | Freq: Every day | ORAL | Status: DC
  Administered 2018-12-31: 6.25 mg via ORAL
  Filled 2018-12-30: qty 2

## 2018-12-30 MED ORDER — ONDANSETRON HCL 4 MG PO TABS: 4.0000 mg | ORAL_TABLET | Freq: Four times a day (QID) | ORAL | Status: DC | PRN

## 2018-12-30 MED ORDER — ACETAMINOPHEN 325 MG PO TABS: 650.0000 mg | ORAL_TABLET | Freq: Four times a day (QID) | ORAL | Status: DC | PRN

## 2018-12-30 MED ORDER — MAGNESIUM HYDROXIDE 400 MG/5ML PO SUSP: 30.0000 mL | Freq: Every day | ORAL | Status: DC | PRN

## 2018-12-30 MED ORDER — DEXAMETHASONE SODIUM PHOSPHATE 10 MG/ML IJ SOLN
10.0000 mg | Freq: Once | INTRAMUSCULAR | Status: AC
  Administered 2018-12-30: 10 mg via INTRAVENOUS
  Filled 2018-12-30: qty 1

## 2018-12-30 MED ORDER — ACETAMINOPHEN 650 MG RE SUPP: 650.0000 mg | Freq: Four times a day (QID) | RECTAL | Status: DC | PRN

## 2018-12-30 NOTE — ED Notes (Signed)
Pt ambulated to restroom and was SOB after his ambulation. Pt's O2 saturation is between 91 to 96 on RA but pt is breathing heavily and in discomfort. Nurse placed pt on [place] 2L for comfort.
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
## 2018-12-30 NOTE — ED Notes (Signed)
Pt called from [facility] to treatment

## 2018-12-30 NOTE — ED Triage Notes (Signed)
Patient c/o chest pain, sob, diarrhea, dizziness, and fatigue X1 week. HX CHF and COPD Denies taking blood thinners. Denies falling
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
## 2018-12-30 NOTE — ED Notes (Signed)
MD at bedside. 

## 2018-12-30 NOTE — ED Notes (Signed)
Nurse spoke with pt's spouse [name] and she was given update. [phone]

## 2018-12-31 DIAGNOSIS — J441 Chronic obstructive pulmonary disease with (acute) exacerbation: Secondary | ICD-10-CM | POA: Diagnosis not present

## 2018-12-31 DIAGNOSIS — Z716 Tobacco abuse counseling: Secondary | ICD-10-CM | POA: Diagnosis not present

## 2018-12-31 DIAGNOSIS — I5023 Acute on chronic systolic (congestive) heart failure: Secondary | ICD-10-CM | POA: Diagnosis not present

## 2018-12-31 DIAGNOSIS — I509 Heart failure, unspecified: Secondary | ICD-10-CM | POA: Diagnosis not present

## 2018-12-31 DIAGNOSIS — I10 Essential (primary) hypertension: Secondary | ICD-10-CM | POA: Diagnosis not present

## 2018-12-31 LAB — CBC
HCT: 38.6 % — ABNORMAL LOW (ref 39.0–52.0)
Hemoglobin: 12.3 g/dL — ABNORMAL LOW (ref 13.0–17.0)
MCH: 26.8 pg (ref 26.0–34.0)
MCHC: 31.9 g/dL (ref 30.0–36.0)
MCV: 84.1 fL (ref 80.0–100.0)
Platelets: 264 10*3/uL (ref 150–400)
RBC: 4.59 MIL/uL (ref 4.22–5.81)
RDW: 17.4 % — ABNORMAL HIGH (ref 11.5–15.5)
WBC: 9.4 10*3/uL (ref 4.0–10.5)
nRBC: 0 % (ref 0.0–0.2)

## 2018-12-31 LAB — BASIC METABOLIC PANEL
Anion gap: 11 (ref 5–15)
BUN: 19 mg/dL (ref 6–20)
CO2: 24 mmol/L (ref 22–32)
Calcium: 9 mg/dL (ref 8.9–10.3)
Chloride: 102 mmol/L (ref 98–111)
Creatinine, Ser: 0.94 mg/dL (ref 0.61–1.24)
GFR calc Af Amer: >60 mL/min (ref >60)
GFR calc non Af Amer: >60 mL/min (ref >60)
Glucose, Bld: 186 mg/dL — ABNORMAL HIGH (ref 70–99)
Potassium: 4.1 mmol/L (ref 3.5–5.1)
Sodium: 137 mmol/L (ref 135–145)

## 2018-12-31 LAB — MAGNESIUM: Magnesium: 2.1 mg/dL (ref 1.7–2.4)

## 2018-12-31 MED ORDER — METHYLPREDNISOLONE SODIUM SUCC 40 MG IJ SOLR: 40.0000 mg | Freq: Two times a day (BID) | INTRAMUSCULAR | Status: DC

## 2018-12-31 MED ORDER — FUROSEMIDE 40 MG PO TABS: 40.0000 mg | ORAL_TABLET | Freq: Two times a day (BID) | ORAL | 1 refills | Status: DC

## 2018-12-31 MED ORDER — NITROGLYCERIN 0.4 MG SL SUBL: 0.4000 mg | SUBLINGUAL_TABLET | SUBLINGUAL | Status: DC | PRN

## 2018-12-31 MED ORDER — AZITHROMYCIN 500 MG PO TABS: 500.0000 mg | ORAL_TABLET | Freq: Every day | ORAL | 0 refills | Status: AC

## 2018-12-31 MED ORDER — PREDNISONE 20 MG PO TABS: 40.0000 mg | ORAL_TABLET | Freq: Every day | ORAL | Status: DC

## 2018-12-31 MED ORDER — FUROSEMIDE 40 MG PO TABS
40.0000 mg | ORAL_TABLET | Freq: Once | ORAL | Status: AC
  Administered 2018-12-31: 14:00:00 40 mg via ORAL
  Filled 2018-12-31: qty 1

## 2018-12-31 MED ORDER — FUROSEMIDE 10 MG/ML IJ SOLN: 40.0000 mg | Freq: Once | INTRAMUSCULAR | Status: DC

## 2018-12-31 MED ORDER — ASPIRIN EC 81 MG PO TBEC
81.0000 mg | DELAYED_RELEASE_TABLET | Freq: Every day | ORAL | Status: DC
  Administered 2018-12-31: 81 mg via ORAL
  Filled 2018-12-31: qty 1

## 2018-12-31 MED ORDER — FUROSEMIDE 10 MG/ML IJ SOLN
40.0000 mg | Freq: Two times a day (BID) | INTRAMUSCULAR | Status: DC
  Administered 2018-12-31: 04:00:00 40 mg via INTRAVENOUS
  Filled 2018-12-31 (×2): qty 4

## 2018-12-31 MED ORDER — NICOTINE 21 MG/24HR TD PT24: 21.0000 mg | MEDICATED_PATCH | Freq: Every day | TRANSDERMAL | 0 refills | Status: DC

## 2018-12-31 MED ORDER — POTASSIUM CHLORIDE 20 MEQ PO PACK
40.0000 meq | PACK | Freq: Once | ORAL | Status: AC
  Administered 2018-12-31: 40 meq via ORAL
  Filled 2018-12-31: qty 2

## 2018-12-31 MED ORDER — PREDNISONE 50 MG PO TABS: 50.0000 mg | ORAL_TABLET | Freq: Every day | ORAL | 0 refills | Status: AC

## 2018-12-31 MED ORDER — AZITHROMYCIN 500 MG PO TABS
500.0000 mg | ORAL_TABLET | Freq: Every day | ORAL | Status: DC
  Administered 2018-12-31: 500 mg via ORAL
  Filled 2018-12-31: qty 1
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
## 2018-12-31 NOTE — TOC Transition Note (Signed)
Transition of Care [facility]) - CM/SW Discharge Note   Patient Details  Name: ADARRIUS GRAEFF MRN: [id number] Date of Birth: [birth year]  Transition of Care [facility]) CM/SW Contact:  Su Hilt, RN Phone Number: 12/31/2018, 11:22 AM   Clinical Narrative:    Met with the patient to discuss DC plan and needs, he states that he lives at home with his wife and that he has no needs, he walks independently He sees Dr Wynetta Emery as PCP and is up to date, goes to [employer], code 52 completed, copy given to patient   Final next level of care: Home/Self Care Barriers to Discharge: Barriers Resolved   Patient Goals and CMS Choice Patient states their goals for this hospitalization and ongoing recovery are:: go home with his wife      Discharge Placement                       Discharge Plan and Services   Discharge Planning Services: CM Consult            DME Arranged: N/A         HH Arranged: NA          Social Determinants of Health (SDOH) Interventions     Readmission Risk Interventions No flowsheet data found.

## 2018-12-31 NOTE — Progress Notes (Signed)
Susanne Borders to be D/C'd Home per MD. Discussed with the patient and all questions fully answered.   VSS, skin clean, dry and intact without evidence of skin break down, no evidence of skin tears noted.  IV catheters discontinued intact. Site without signs and symptoms of complications. Dressing and pressure applied.   An after visit summary was printed and given to the patient. Patient received prescriptions.   D/c education completed with patient/family including follow up instructions, medications list, d/c activities limitations if indicated, with other d/c instructions as indicated by MD- patient able to verbalize understanding, all questions fully answered.   Patient instructed to return to ED, call 911, or Call MD for any changes in condition.  Patient escorted via [facility] and D/C home via private auto.

## 2018-12-31 NOTE — Progress Notes (Signed)
PT Cancellation Note  Patient Details Name: Chad Gibbs MRN: [id number] DOB: 1966-01-01   Cancelled Treatment:    Reason Eval/Treat Not Completed: Other (comment). Consult received and chart reviewed. Pt currently eating breakfast seated in recliner. Pt currently on enteric isolation. Per RN, pt is independent with ambulation in room with O2 sats WNL. No skilled needs identified at this time. Cleared to dc order. Please re-order if needs change. Pt requesting chaplin to visit, RN to page them.   [name] 12/31/2018, 10:26 AM Greggory Stallion, PT, DPT [phone]

## 2018-12-31 NOTE — Evaluation (Signed)
Occupational Therapy Evaluation Patient Details Name: Chad Gibbs MRN: [id number] DOB: [birth year] Today's Date: 12/31/2018    History of Present Illness Pt. is a 53 y.o. male who was admitted to [facility] with COPD Exacerbation. Pt. PMHx includes: SOB, Dizziness, Fatigue, Chest Pain, Allergic Rhinitis, Migraines.   Clinical Impression   Pt. presents with weakness, and limited activity tolerance which hinders his ability to complete basic ADL and IADL functioning. Pt. resides at home with his wife, and was independent with ADLs, and IADL functioning: including meal preparation, and medication management. Pt. reports that he does most of the home management tasks. Pt. was able to drive. Pt. education was provided about energy conservation, and work simplification techniques during ADLs, and IADL tasks. Reviewed pursed lip breathing techniques. Pt. SO2 97%, HR:94. Pt. Is independent with basic ADL tasks. No further OT services are warranted at this time. Will discharge, and complete the OT order.    Follow Up Recommendations  No OT follow up    Equipment Recommendations       Recommendations for Other Services       Precautions / Restrictions        Mobility Bed Mobility    Independent              Transfers       Independent              Balance                                           ADL either performed or assessed with clinical judgement   ADL Overall ADL's : Independent                                       General ADL Comments: Pt. is independent with basic ADL taskes with increased time, and rest breaks.     Vision Baseline Vision/History: Wears glasses Wears Glasses: Reading only       Perception     Praxis      Pertinent Vitals/Pain Pain Assessment: No/denies pain     Hand Dominance Left   Extremity/Trunk Assessment Upper Extremity Assessment Upper Extremity Assessment: Overall WFL for tasks  assessed           Communication Communication Communication: No difficulties   Cognition Arousal/Alertness: Awake/alert Behavior During Therapy: WFL for tasks assessed/performed Overall Cognitive Status: Within Functional Limits for tasks assessed                                     General Comments       Exercises     Shoulder Instructions      Home Living Family/patient expects to be discharged to:: Private residence Living Arrangements: Spouse/significant other Available Help at Discharge: Family Type of Home: Mobile home Home Access: Stairs to enter Entrance Stairs-Number of Steps: 6   Home Layout: One level     Bathroom Shower/Tub: Tub/shower unit         Home Equipment: Shower seat;Cane - single point          Prior Functioning/Environment Level of Independence: Independent        Comments: Pt. was independent with house cleaning tasks, cooking, and was  able to drive.        OT Problem List: Decreased activity tolerance      OT Treatment/Interventions: Self-care/ADL training;Therapeutic exercise;DME and/or AE instruction    OT Goals(Current goals can be found in the care plan section) Acute Rehab OT Goals Patient Stated Goal: To return home OT Goal Formulation: With patient Potential to Achieve Goals: Good  OT Frequency: Min 2X/week   Barriers to D/C:            Co-evaluation              AM-PAC OT "6 Clicks" Daily Activity     Outcome Measure Help from another person eating meals?: None Help from another person taking care of personal grooming?: None Help from another person toileting, which includes using toliet, bedpan, or urinal?: None Help from another person bathing (including washing, rinsing, drying)?: None Help from another person to put on and taking off regular upper body clothing?: None Help from another person to put on and taking off regular lower body clothing?: None 6 Click Score: 24   End of  Session    Activity Tolerance: Patient tolerated treatment well Patient left: in bed;with call bell/phone within reach;with bed alarm set(Sitting at the EOB)  OT Visit Diagnosis: Muscle weakness (generalized) (M62.81)                Time: 1110-1135 OT Time Calculation (min): 25 min Charges:  OT General Charges $OT Visit: 1 Visit OT Evaluation $OT Eval Moderate Complexity: 1 Mod  Olegario MessierElaine Tempie Gibeault, MS, OTR/L  Olegario Messierlaine Hasaan Radde 12/31/2018, 12:05 PM
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
## 2018-12-31 NOTE — ED Notes (Signed)
Pt oxygen saturation dropped to 88% on room air. Pt placed on 2L nasal cannula. Oxygen saturation now 96% on 2L. MD aware.

## 2018-12-31 NOTE — ED Notes (Signed)
Pt reports improved breathing since arrival, pt is alert and oriented at this time, pt ate ice cream and drank apple juice. Pt does get short of breath when speaking in long sentences.

## 2018-12-31 NOTE — Discharge Instructions (Signed)
Heart Failure, Diagnosis ° °Heart failure means that your heart is not able to pump blood in the right way. This makes it hard for your body to work well. Heart failure is usually a long-term (chronic) condition. You must take good care of yourself and follow your treatment plan from your doctor. °What are the causes? °This condition may be caused by: °· High blood pressure. °· Build up of cholesterol and fat in the arteries. °· Heart attack. This injures the heart muscle. °· Heart valves that do not open and close properly. °· Damage of the heart muscle. This is also called cardiomyopathy. °· Lung disease. °· Abnormal heart rhythms. °What increases the risk? °The risk of heart failure goes up as a person ages. This condition is also more likely to develop in people who: °· Are overweight. °· Are male. °· Smoke or chew tobacco. °· Abuse alcohol or illegal drugs. °· Have taken medicines that can damage the heart. °· Have diabetes. °· Have abnormal heart rhythms. °· Have thyroid problems. °· Have low blood counts (anemia). °What are the signs or symptoms? °Symptoms of this condition include: °· Shortness of breath. °· Coughing. °· Swelling of the feet, ankles, legs, or belly. °· Losing weight for no reason. °· Trouble breathing. °· Waking from sleep because of the need to sit up and get more air. °· Rapid heartbeat. °· Being very tired. °· Feeling dizzy, or feeling like you may pass out (faint). °· Having no desire to eat. °· Feeling like you may vomit (nauseous). °· Peeing (urinating) more at night. °· Feeling confused. °How is this treated? ° °  ° °This condition may be treated with: °· Medicines. These can be given to treat blood pressure and to make the heart muscles stronger. °· Changes in your daily life. These may include eating a healthy diet, staying at a healthy body weight, quitting tobacco and illegal drug use, or doing exercises. °· Surgery. Surgery can be done to open blocked valves, or to put devices in  the heart, such as pacemakers. °· A donor heart (heart transplant). You will receive a healthy heart from a donor. °Follow these instructions at home: °· Treat other conditions as told by your doctor. These may include high blood pressure, diabetes, thyroid disease, or abnormal heart rhythms. °· Learn as much as you can about heart failure. °· Get support as you need it. °· Keep all follow-up visits as told by your doctor. This is important. °Summary °· Heart failure means that your heart is not able to pump blood in the right way. °· This condition is caused by high blood pressure, heart attack, or damage of the heart muscle. °· Symptoms of this condition include shortness of breath and swelling of the feet, ankles, legs, or belly. You may also feel very tired or feel like you may vomit. °· You may be treated with medicines, surgery, or changes in your daily life. °· Treat other health conditions as told by your doctor. °This information is not intended to replace advice given to you by your health care provider. Make sure you discuss any questions you have with your health care provider. °Document Released: 03/06/2008 Document Revised: 08/15/2018 Document Reviewed: 08/15/2018 °Elsevier Patient Education © 2020 [employer] ° °

## 2018-12-31 NOTE — Care Management CC44 (Signed)
Condition Code 44 Documentation Completed  Patient Details  Name: Chad Gibbs MRN: [id number] Date of Birth: [birth year]   Condition Code 44 given:  Yes Patient signature on Condition Code 44 notice:  Yes Documentation of 2 MD's agreement:  Yes Code 44 added to claim:  Yes    Su Hilt, RN 12/31/2018, 11:18 AM

## 2018-12-31 NOTE — Progress Notes (Signed)
Nutrition Brief Note  RD received consult for assessment of nutrition requirements/status per COPD Gold protocol  Wt Readings from Last 15 Encounters:  12/31/18 113.6 kg  10/27/18 129.7 kg  10/20/18 132 kg  08/25/18 123.5 kg  01/06/18 119.3 kg  02/18/17 113.4 kg  01/07/17 112.5 kg  01/03/17 112.7 kg  12/26/16 112.9 kg  12/18/16 114 kg  04/10/11 115.4 kg  03/27/11 84.58 kg   53 year old male with PMHx of CAD, CHF, asthma, hx EtOH abuse admitted with acute on chronic CHF, mild COPD exacerbation.  Spoke with patient over the phone to preserve PPE as patient is on enteric precautions. Patient reports he has a good appetite and intake at baseline. He eats 100% of three meals per day at home. He follows low-sodium, heart-healthy diet in setting of CHF and CAD and reports he is very familiar with diet and does not need more education. He is waiting on hot try to be delivered for breakfast as his last tray had gotten cold before being brought into room. He reports he will be able to eat well here. Patient reports his weight had gotten up to 305 lbs from fluid when he was first diagnosed with CHF and at time of his heart attack (first cardiology consult was in 10/2018 so may have been around that time). His current dry weight is around 252 lbs and he is working on intentionally losing weight so he does not have to have a hernia surgery. No nutrition questions or concerns at this time.  Body mass index is 33.04 kg/m. Patient meets criteria for obesity class I based on current BMI.   Current diet order is heart healthy. Labs and medications reviewed.   No nutrition interventions warranted at this time. If nutrition issues arise, please consult RD.   Willey Blade, MS, [place], LDN Office: [phone] Pager: [phone] After Hours/Weekend Pager: [phone]

## 2019-01-01 ENCOUNTER — Other Ambulatory Visit: Payer: Self-pay

## 2019-01-01 ENCOUNTER — Encounter: Payer: Self-pay | Admitting: Family

## 2019-01-01 ENCOUNTER — Ambulatory Visit: Payer: Medicare HMO | Attending: Family | Admitting: Family

## 2019-01-01 VITALS — BP 100/83 | HR 97 | Resp 18 | Ht 73.0 in | Wt 253.2 lb

## 2019-01-01 DIAGNOSIS — Z7951 Long term (current) use of inhaled steroids: Secondary | ICD-10-CM | POA: Diagnosis not present

## 2019-01-01 DIAGNOSIS — I890 Lymphedema, not elsewhere classified: Secondary | ICD-10-CM | POA: Insufficient documentation

## 2019-01-01 DIAGNOSIS — F17210 Nicotine dependence, cigarettes, uncomplicated: Secondary | ICD-10-CM | POA: Diagnosis not present

## 2019-01-01 DIAGNOSIS — I110 Hypertensive heart disease with heart failure: Secondary | ICD-10-CM | POA: Insufficient documentation

## 2019-01-01 DIAGNOSIS — Z8249 Family history of ischemic heart disease and other diseases of the circulatory system: Secondary | ICD-10-CM | POA: Diagnosis not present

## 2019-01-01 DIAGNOSIS — Z885 Allergy status to narcotic agent status: Secondary | ICD-10-CM | POA: Diagnosis not present

## 2019-01-01 DIAGNOSIS — R0683 Snoring: Secondary | ICD-10-CM

## 2019-01-01 DIAGNOSIS — I5022 Chronic systolic (congestive) heart failure: Secondary | ICD-10-CM | POA: Insufficient documentation

## 2019-01-01 DIAGNOSIS — J441 Chronic obstructive pulmonary disease with (acute) exacerbation: Secondary | ICD-10-CM | POA: Insufficient documentation

## 2019-01-01 DIAGNOSIS — Z79899 Other long term (current) drug therapy: Secondary | ICD-10-CM | POA: Diagnosis not present

## 2019-01-01 DIAGNOSIS — R69 Illness, unspecified: Secondary | ICD-10-CM | POA: Diagnosis not present

## 2019-01-01 DIAGNOSIS — J449 Chronic obstructive pulmonary disease, unspecified: Secondary | ICD-10-CM

## 2019-01-01 DIAGNOSIS — I10 Essential (primary) hypertension: Secondary | ICD-10-CM

## 2019-01-01 LAB — HIV ANTIBODY (ROUTINE TESTING W REFLEX): HIV Screen 4th Generation wRfx: NONREACTIVE

## 2019-01-01 NOTE — Patient Instructions (Addendum)
Begin weighing daily and call for an overnight weight gain of > 2 pounds or a weekly weight gain of >5 pounds. 
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
## 2019-01-05 ENCOUNTER — Telehealth (HOSPITAL_COMMUNITY): Payer: Self-pay

## 2019-01-06 NOTE — Telephone Encounter (Signed)
Attempted to contact to set up appt to meet him.  No answer, left message.  Will continue to contact.   [place] [phone]

## 2019-01-07 ENCOUNTER — Ambulatory Visit: Payer: Self-pay | Admitting: Pharmacist

## 2019-01-07 ENCOUNTER — Telehealth: Payer: Self-pay

## 2019-01-07 DIAGNOSIS — E782 Mixed hyperlipidemia: Secondary | ICD-10-CM | POA: Diagnosis not present

## 2019-01-07 DIAGNOSIS — I2510 Atherosclerotic heart disease of native coronary artery without angina pectoris: Secondary | ICD-10-CM | POA: Diagnosis not present

## 2019-01-07 DIAGNOSIS — I350 Nonrheumatic aortic (valve) stenosis: Secondary | ICD-10-CM | POA: Diagnosis not present

## 2019-01-07 DIAGNOSIS — I5022 Chronic systolic (congestive) heart failure: Secondary | ICD-10-CM | POA: Diagnosis not present

## 2019-01-07 NOTE — Chronic Care Management (AMB) (Signed)
  Chronic Care Management   Note  01/07/2019 Name: Chad Gibbs MRN: [id number] DOB: [birth year]  Chad Gibbs is a 53 y.o. year old male who is a primary care patient of Valerie Roys, DO. The CCM team was consulted for assistance with chronic disease management and care coordination needs.    Attempted to contact patient to follow up on medication management, including recent hospitalization, appointment with CHF clinic, and our previous conversations regarding tobacco cessation. Listed home number was not working, but left HIPAA compliant messages for both he and his wife on their cell phones to return my call at their convenience.   Follow up plan: - If I do not hear back, will outreach again in 1-2 weeks.   Catie Darnelle Maffucci, PharmD Clinical Pharmacist [place] [phone]

## 2019-01-09 ENCOUNTER — Telehealth: Payer: Self-pay

## 2019-01-12 ENCOUNTER — Telehealth: Payer: Self-pay

## 2019-01-12 ENCOUNTER — Ambulatory Visit: Payer: Self-pay | Admitting: Licensed Clinical Social Worker

## 2019-01-12 NOTE — Chronic Care Management (AMB) (Signed)
  Chronic Care Management    Clinical Social Work Follow Up Note  01/12/2019 Name: Chad Gibbs MRN: [id number] DOB: December 22, 1965  Chad Gibbs is a 53 y.o. year old male who is a primary care patient of Valerie Roys, DO. The CCM team was consulted for assistance with Financial Difficulties related to housing.   Review of patient status, including review of consultants reports, other relevant assessments, and collaboration with appropriate care team members and the patient's provider was performed as part of comprehensive patient evaluation and provision of chronic care management services.     LCSW completed CCM outreach attempt today but was unable to reach patient successfully. A HIPPA compliant voice message was left encouraging patient to return call once available. LCSW rescheduled CCM SW appointment as well.   Follow Up Plan: SW will follow up with patient by phone over the next 30 days  Eula Fried, [place], MSW, [email or web address] Phone: [phone]

## 2019-01-16 ENCOUNTER — Ambulatory Visit: Payer: Self-pay | Admitting: Pharmacist

## 2019-01-16 ENCOUNTER — Telehealth: Payer: Self-pay

## 2019-01-16 NOTE — Chronic Care Management (AMB) (Signed)
  Chronic Care Management   Note  01/16/2019 Name: Chad Gibbs MRN: [id number] DOB: 1966/02/21  Chad Gibbs is a 53 y.o. year old male who is a primary care patient of Valerie Roys, DO. The CCM team was consulted for assistance with chronic disease management and care coordination needs.    Unsuccessful outreach attempt to patient; left HIPAA compliant message on home and cell numbers to call me back at his convenience.   Follow up plan: - CCM team will attempt outreach again in the next 4-5 weeks  Catie Darnelle Maffucci, PharmD Clinical Pharmacist [place] [phone]

## 2019-01-20 ENCOUNTER — Telehealth: Payer: Self-pay

## 2019-01-21 ENCOUNTER — Telehealth (HOSPITAL_COMMUNITY): Payer: Self-pay

## 2019-01-21 DIAGNOSIS — R600 Localized edema: Secondary | ICD-10-CM | POA: Diagnosis not present

## 2019-01-21 DIAGNOSIS — I2510 Atherosclerotic heart disease of native coronary artery without angina pectoris: Secondary | ICD-10-CM | POA: Diagnosis not present

## 2019-01-21 DIAGNOSIS — E782 Mixed hyperlipidemia: Secondary | ICD-10-CM | POA: Diagnosis not present

## 2019-01-21 DIAGNOSIS — I5022 Chronic systolic (congestive) heart failure: Secondary | ICD-10-CM | POA: Diagnosis not present

## 2019-01-21 DIAGNOSIS — I350 Nonrheumatic aortic (valve) stenosis: Secondary | ICD-10-CM | POA: Diagnosis not present

## 2019-01-21 NOTE — Telephone Encounter (Signed)
Attempted to contact several times, home phone not in service, left message on cell phone but another lady contacted me back and advised I am calling the wrong number and wifes number goes straight to voice mail.  Will go out to visit to see if he is interested in the program and get a working number.   [place] [phone]

## 2019-01-23 ENCOUNTER — Telehealth: Payer: Self-pay

## 2019-01-23 ENCOUNTER — Other Ambulatory Visit (HOSPITAL_COMMUNITY): Payer: Self-pay

## 2019-01-23 NOTE — Progress Notes (Signed)
Chad Gibbs was referred by Darylene Price to the community paramedic program at his visit. I have attempted to contact several times, home phone not working and cell phone left messages but received a phone call stating that this was not his number.  Wifes phone number unable to leave message and no one answers.  Went to attempt to visit at residence but when arrived no answer and appears to be abandoned.   Will advise HF clinic of findings.   [place] [phone]

## 2019-01-27 ENCOUNTER — Ambulatory Visit: Payer: Self-pay | Admitting: Licensed Clinical Social Worker

## 2019-01-27 NOTE — Chronic Care Management (AMB) (Signed)
  Chronic Care Management    Clinical Social Work Follow Up Note  01/27/2019 Name: TEJAS SEAWOOD MRN: [id number] DOB: 1966/04/07  [name] is a 53 y.o. year old male who is a primary care patient of Valerie Roys, DO. The CCM team was consulted for assistance with [employer] .   Review of patient status, including review of consultants reports, other relevant assessments, and collaboration with appropriate care team members and the patient's provider was performed as part of comprehensive patient evaluation and provision of chronic care management services.     LCSW completed CCM outreach attempt today but was unable to reach patient successfully. A HIPPA compliant voice message was left encouraging patient to return call once available. LCSW rescheduled CCM SW appointment as well.  Follow Up Plan: SW will follow up with patient by phone over the next month  Eula Fried, [place], MSW, [email or web address] Phone: [phone]
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
## 2019-01-29 ENCOUNTER — Other Ambulatory Visit: Payer: Self-pay

## 2019-01-29 ENCOUNTER — Ambulatory Visit: Payer: Medicare HMO | Attending: Family | Admitting: Family

## 2019-01-29 ENCOUNTER — Encounter: Payer: Self-pay | Admitting: Family

## 2019-01-29 VITALS — BP 106/78 | HR 82 | Resp 18 | Ht 72.0 in | Wt 242.4 lb

## 2019-01-29 DIAGNOSIS — Z79899 Other long term (current) drug therapy: Secondary | ICD-10-CM | POA: Diagnosis not present

## 2019-01-29 DIAGNOSIS — Z825 Family history of asthma and other chronic lower respiratory diseases: Secondary | ICD-10-CM | POA: Insufficient documentation

## 2019-01-29 DIAGNOSIS — I509 Heart failure, unspecified: Secondary | ICD-10-CM | POA: Diagnosis present

## 2019-01-29 DIAGNOSIS — Z7984 Long term (current) use of oral hypoglycemic drugs: Secondary | ICD-10-CM | POA: Diagnosis not present

## 2019-01-29 DIAGNOSIS — I10 Essential (primary) hypertension: Secondary | ICD-10-CM

## 2019-01-29 DIAGNOSIS — Z7951 Long term (current) use of inhaled steroids: Secondary | ICD-10-CM | POA: Diagnosis not present

## 2019-01-29 DIAGNOSIS — Z885 Allergy status to narcotic agent status: Secondary | ICD-10-CM | POA: Insufficient documentation

## 2019-01-29 DIAGNOSIS — R69 Illness, unspecified: Secondary | ICD-10-CM | POA: Diagnosis not present

## 2019-01-29 DIAGNOSIS — J449 Chronic obstructive pulmonary disease, unspecified: Secondary | ICD-10-CM | POA: Insufficient documentation

## 2019-01-29 DIAGNOSIS — R0683 Snoring: Secondary | ICD-10-CM | POA: Diagnosis not present

## 2019-01-29 DIAGNOSIS — F17210 Nicotine dependence, cigarettes, uncomplicated: Secondary | ICD-10-CM | POA: Diagnosis not present

## 2019-01-29 DIAGNOSIS — I5022 Chronic systolic (congestive) heart failure: Secondary | ICD-10-CM | POA: Insufficient documentation

## 2019-01-29 DIAGNOSIS — I890 Lymphedema, not elsewhere classified: Secondary | ICD-10-CM | POA: Insufficient documentation

## 2019-01-29 DIAGNOSIS — I110 Hypertensive heart disease with heart failure: Secondary | ICD-10-CM | POA: Insufficient documentation

## 2019-01-29 NOTE — Patient Instructions (Addendum)
Continue weighing daily and call for an overnight weight gain of > 2 pounds or a weekly weight gain of >5 pounds.  Paramedic will show up one day next week to discuss the program.

## 2019-01-30 ENCOUNTER — Ambulatory Visit: Payer: Self-pay | Admitting: Pharmacist

## 2019-01-30 ENCOUNTER — Ambulatory Visit: Payer: Self-pay | Admitting: *Deleted

## 2019-01-30 DIAGNOSIS — I509 Heart failure, unspecified: Secondary | ICD-10-CM

## 2019-01-30 DIAGNOSIS — R0602 Shortness of breath: Secondary | ICD-10-CM

## 2019-01-30 NOTE — Chronic Care Management (AMB) (Signed)
Chronic Care Management   Follow Up Note   01/30/2019 Name: Chad Gibbs MRN: [id number] DOB: May 20, 1966  Referred by: Valerie Roys, DO Reason for referral : Chronic Care Management (Medication Management )   Chad Gibbs is a 53 y.o. year old male who is a primary care patient of Valerie Roys, DO. The CCM team was consulted for assistance with chronic disease management and care coordination needs.    Met with patient in clinic today.   Review of patient status, including review of consultants reports, relevant laboratory and other test results, and collaboration with appropriate care team members and the patient's provider was performed as part of comprehensive patient evaluation and provision of chronic care management services.    SDOH (Social Determinants of Health) screening performed today: Financial Strain  Social Connections. See Care Plan for related entries.   Outpatient Encounter Medications as of 01/30/2019  Medication Sig Note  . albuterol (PROVENTIL HFA;VENTOLIN HFA) 108 (90 Base) MCG/ACT inhaler Inhale 2 puffs into the lungs every 6 (six) hours as needed for wheezing or shortness of breath. 11/26/2018: Taking about 3 times daily   . carvedilol (COREG) 6.25 MG tablet Take 6.25 mg by mouth daily.    [name] ENTRESTO 24-26 MG Take 1 tablet by mouth 2 (two) times daily.   [name] FARXIGA 10 MG TABS tablet Take 1 tablet by mouth daily.   . furosemide (LASIX) 40 MG tablet Take 1 tablet (40 mg total) by mouth 2 (two) times daily. (Patient taking differently: Take 40 mg by mouth daily. )   . mometasone-formoterol (DULERA) 200-5 MCG/ACT AERO Inhale 2 puffs into the lungs 2 (two) times daily.   . SYMBICORT 160-4.5 MCG/ACT inhaler Inhale 2 puffs into the lungs 2 (two) times a day. 12/30/2018: Wife states this medication did not work and he did not wish to take it anymore.    No facility-administered encounter medications on file as of 01/30/2019.      Goals Addressed            This Visit's Progress     Patient Stated   . "I want to take care of my heart" (pt-stated)       Current Barriers:  [name] Knowledge Deficits related to management of HF  - new diagnosis on Care Everywhere per [facility] - HFrEF, EF 20-25%; stress testing showed potentially previous infarct  o Treated currently with carvedilol 6.25 mg BID, Entresto 24/26 BID, Farxiga 10 mg daily, furosemide 80 mg daily o Notes that he hasn't started [place] yet because he was told the medication would be free with a coupon card; o Notes that he got a new phone number, which is why we haven't been able to reach him  Pharmacist Clinical Goal(s):  [name] Over the next 90 days, patient will work with PharmD and primary care team to address needs related to optimized heart failure management  Interventions: . Discussed that coupon cards cannot be used in combination with Medicare insurance. He verbalized understanding.  . Discussed mechanism of action of Farxiga therapy.  . Discussed that he is still using a pill box and weighing regularly, but still having fluctuations in his fluid status, likely d/t diet   Patient Self Care Activities:  . Self administers medications as prescribed . Calls provider office for new concerns or questions  Please see past updates related to this goal by clicking on the "Past Updates" button in the selected goal  Plan:  - Will outreach patient in the next 1-3 weeks for continued medication management support  Catie Darnelle Maffucci, PharmD Clinical Pharmacist [place] [phone]

## 2019-01-30 NOTE — Patient Instructions (Signed)
Visit Information  Goals Addressed            This Visit's Progress     Patient Stated   . "I want to take care of my heart" (pt-stated)       Current Barriers:  [name] Knowledge Deficits related to management of HF  - new diagnosis on Care Everywhere per [facility] - HFrEF, EF 20-25%; stress testing showed potentially previous infarct  o Treated currently with carvedilol 6.25 mg BID, Entresto 24/26 BID, Farxiga 10 mg daily, furosemide 80 mg daily o Notes that he hasn't started [place] yet because he was told the medication would be free with a coupon card; o Notes that he got a new phone number, which is why we haven't been able to reach him  Pharmacist Clinical Goal(s):  [name] Over the next 90 days, patient will work with PharmD and primary care team to address needs related to optimized heart failure management  Interventions: . Discussed that coupon cards cannot be used in combination with Medicare insurance. He verbalized understanding.  . Discussed mechanism of action of Farxiga therapy.  . Discussed that he is still using a pill box and weighing regularly, but still having fluctuations in his fluid status, likely d/t diet   Patient Self Care Activities:  . Self administers medications as prescribed . Calls provider office for new concerns or questions  Please see past updates related to this goal by clicking on the "Past Updates" button in the selected goal         The patient verbalized understanding of instructions provided today and declined a print copy of patient instruction materials.  Plan:  - Will outreach patient in the next 1-3 weeks for continued medication management support  Catie Darnelle Maffucci, PharmD Clinical Pharmacist [place] [phone]

## 2019-02-01 NOTE — Chronic Care Management (AMB) (Signed)
Chronic Care Management   Follow Up Note   02/01/2019 Name: Chad Gibbs MRN: [id number] DOB: 05/19/1966  Referred by: Valerie Roys, DO Reason for referral : Chronic Care Management (HF)   Chad Gibbs is a 53 y.o. year old male who is a primary care patient of Valerie Roys, DO. The CCM team was consulted for assistance with chronic disease management and care coordination needs.    Review of patient status, including review of consultants reports, relevant laboratory and other test results, and collaboration with appropriate care team members and the patient's provider was performed as part of comprehensive patient evaluation and provision of chronic care management services.    SDOH (Social Determinants of Health) screening performed today: Data processing manager Insecurity  Stress. See Care Plan for related entries.   Outpatient Encounter Medications as of 01/30/2019  Medication Sig Note  . albuterol (PROVENTIL HFA;VENTOLIN HFA) 108 (90 Base) MCG/ACT inhaler Inhale 2 puffs into the lungs every 6 (six) hours as needed for wheezing or shortness of breath. 11/26/2018: Taking about 3 times daily   . carvedilol (COREG) 6.25 MG tablet Take 6.25 mg by mouth daily.    [name] ENTRESTO 24-26 MG Take 1 tablet by mouth 2 (two) times daily.   [name] FARXIGA 10 MG TABS tablet Take 1 tablet by mouth daily.   . furosemide (LASIX) 40 MG tablet Take 1 tablet (40 mg total) by mouth 2 (two) times daily. (Patient taking differently: Take 40 mg by mouth daily. )   . mometasone-formoterol (DULERA) 200-5 MCG/ACT AERO Inhale 2 puffs into the lungs 2 (two) times daily.   . SYMBICORT 160-4.5 MCG/ACT inhaler Inhale 2 puffs into the lungs 2 (two) times a day. 12/30/2018: Wife states this medication did not work and he did not wish to take it anymore.    No facility-administered encounter medications on file as of 01/30/2019.      Goals Addressed            This Visit's Progress   . I need to  know what to eat for my Heart (pt-stated)       Current Barriers:  [name] Knowledge deficit related to basic heart failure pathophysiology and self care management . Financial strain . Limited Social Support . Non-Stable housing . Food insecurity   Case Manager Clinical Goal(s):  [name] Over the next 30 days, patient will weigh self daily and record  . Over the next 90 days patient will verbalize understanding of how to read labels to adhere to a low sodium diet  Interventions:  . Provided verbal education on low sodium diet . Provided written education on low sodium diet . Advised patient to weigh each morning after emptying bladder . Discussed importance of daily weight and advised patient to weigh and record daily . Reviewed role of diuretics in prevention of fluid overload and management of heart failure . Patient showed up at PCP office, contact information updated in chart. Met with patient and spouse [name]. Both patient and spouse stated their home was not stable and they really needed new housing. They both were carrying their medications in plastic grocery bags in their car. Patient's spouse stated they did not have much money or food. Both the patient and spouse were wearing visibly soiled clothing and looked unkempt. Patient said things have been tight related to his spouse was in the hospital out of town for over two weeks.  . Collaboration with CCM SW to make  her aware of patient's continued need for housing resources. . Patient given Ensure samples . C-3 referral made for food pantry resources . New contact information given to patient's HF provider and Paramedicine provider.  Patient Self Care Activities:   . Takes Heart Failure Medications as prescribed  Please see past updates related to this goal by clicking on the "Past Updates" button in the selected goal         The care management team will reach out to the patient again over the next 30 days.  The patient has been  provided with contact information for the care management team and has been advised to call with any health related questions or concerns.    Merlene Morse Olin Gurski RN, BSN Nurse Case Editor, [facility]ing [facility]  [phone]) Business Mobile

## 2019-02-01 NOTE — Patient Instructions (Signed)
Thank you allowing the Chronic Care Management Team to be a part of your care! It was a pleasure speaking with you today!   CCM (Chronic Care Management) Team   Dayna Alia RN, BSN Nurse Care Coordinator  [phone]  Catie Travis PharmD  Clinical Pharmacist  [phone]  Brooke Joyce LCSW Clinical Social Worker [phone]    The patient verbalized understanding of instructions provided today and declined a print copy of patient instruction materials.   The patient has been provided with contact information for the care management team and has been advised to call with any health related questions or concerns.   

## 2019-02-05 ENCOUNTER — Ambulatory Visit (INDEPENDENT_AMBULATORY_CARE_PROVIDER_SITE_OTHER): Payer: Medicare HMO | Admitting: Pharmacist

## 2019-02-05 ENCOUNTER — Other Ambulatory Visit (HOSPITAL_COMMUNITY): Payer: Self-pay

## 2019-02-05 DIAGNOSIS — E782 Mixed hyperlipidemia: Secondary | ICD-10-CM | POA: Diagnosis not present

## 2019-02-05 DIAGNOSIS — R0602 Shortness of breath: Secondary | ICD-10-CM

## 2019-02-05 DIAGNOSIS — I509 Heart failure, unspecified: Secondary | ICD-10-CM

## 2019-02-05 DIAGNOSIS — I5022 Chronic systolic (congestive) heart failure: Secondary | ICD-10-CM | POA: Diagnosis not present

## 2019-02-05 DIAGNOSIS — G4733 Obstructive sleep apnea (adult) (pediatric): Secondary | ICD-10-CM | POA: Diagnosis not present

## 2019-02-05 DIAGNOSIS — I350 Nonrheumatic aortic (valve) stenosis: Secondary | ICD-10-CM | POA: Diagnosis not present

## 2019-02-05 DIAGNOSIS — I2510 Atherosclerotic heart disease of native coronary artery without angina pectoris: Secondary | ICD-10-CM | POA: Diagnosis not present

## 2019-02-05 NOTE — Patient Instructions (Signed)
Visit Information  Goals Addressed            This Visit's Progress     Patient Stated   . "I want to take care of my heart" (pt-stated)       Current Barriers:  Chad Gibbs Knowledge Deficits related to management of HF  - new diagnosis on Care Everywhere per [facility] - HFrEF, EF 20-25%; stress testing showed potentially previous infarct  o Treated currently with carvedilol 6.25 mg BID, Entresto 24/26 BID, Farxiga 10 mg daily, furosemide 40 mg daily. Seen by [facility] HF clinic, and paramedicine program is attempting to establish with him o Notes he gained ~4 lbs in the past 2 weeks, reports it was due to food insecurity d/t transportation concerns. He reports this is resolved, and denies any community support needs for food at this time o Wonders why he needs more medications for HF. Notes he saw Dr. Nehemiah Massed today and had a positive appointment.   Pharmacist Clinical Goal(s):  Chad Gibbs Over the next 90 days, patient will work with PharmD and primary care team to address needs related to optimized heart failure management  Interventions: . Reviewed differing mechanisms of action for furosemide, carvedilol, Entresto, and why he needs all of these. He states understanding. Confirms that he is still using his pill box and reports compliance.  . Confirmed his new cell phone number . Messaged paramedicine Nile Dear to let her know patient was at [facility] appointment today when she stopped by.  . Will collaborate with Janci Minor, RN regarding sleep study needs . Will collaborate with Eula Fried, LCSW for community resource needs   Patient Self Care Activities:  . Self administers medications as prescribed . Calls provider office for new concerns or questions  Please see past updates related to this goal by clicking on the "Past Updates" button in the selected goal         The patient verbalized understanding of instructions provided today and declined a print copy of patient instruction  materials.   Plan:  - Will outreach patient in the next 4-5 weeks for continued medication management support  Catie Darnelle Maffucci, PharmD Clinical Pharmacist [place] [phone]

## 2019-02-05 NOTE — Chronic Care Management (AMB) (Signed)
Chronic Care Management   Follow Up Note   02/05/2019 Name: Chad Gibbs MRN: [id number] DOB: Jun 28, 1965  Referred by: Dorcas CarrowJohnson, Megan P, DO Reason for referral : Chronic Care Management (Medication Management)   Chad Gibbs is a 53 y.o. year old male who is a primary care patient of Dorcas CarrowJohnson, Megan P, DO. The CCM team was consulted for assistance with chronic disease management and care coordination needs.  Contacted patient for medication management support    Review of patient status, including review of consultants reports, relevant laboratory and other test results, and collaboration with appropriate care team members and the patient's provider was performed as part of comprehensive patient evaluation and provision of chronic care management services.    SDOH (Social Determinants of Health) screening performed today:  Geographical information systems officerTransportation Financial Strain  Food Insecurity . See Care Plan for related entries.   Outpatient Encounter Medications as of 02/05/2019  Medication Sig Note  . carvedilol (COREG) 6.25 MG tablet Take 6.25 mg by mouth daily.    [name]. ENTRESTO 24-26 MG Take 1 tablet by mouth 2 (two) times daily.   [name]. FARXIGA 10 MG TABS tablet Take 10 mg by mouth daily.    . furosemide (LASIX) 40 MG tablet Take 1 tablet (40 mg total) by mouth 2 (two) times daily. (Patient taking differently: Take 40 mg by mouth daily. )   . mometasone-formoterol (DULERA) 200-5 MCG/ACT AERO Inhale 2 puffs into the lungs 2 (two) times daily.   [name]. albuterol (PROVENTIL HFA;VENTOLIN HFA) 108 (90 Base) MCG/ACT inhaler Inhale 2 puffs into the lungs every 6 (six) hours as needed for wheezing or shortness of breath. (Patient not taking: Reported on 02/05/2019)   . [DISCONTINUED] SYMBICORT 160-4.5 MCG/ACT inhaler Inhale 2 puffs into the lungs 2 (two) times a day. 12/30/2018: Wife states this medication did not work and he did not wish to take it anymore.    No facility-administered encounter medications on file as  of 02/05/2019.      Goals Addressed            This Visit's Progress     Patient Stated   . "I want to take care of my heart" (pt-stated)       Current Barriers:  [name]. Knowledge Deficits related to management of HF  - [facility] - HFrEF, EF 20-25%; stress testing showed potentially previous infarct  o Treated currently with carvedilol 6.25 mg BID, Entresto 24/26 BID, Farxiga 10 mg daily, furosemide 40 mg daily. [facility], and paramedicine program is attempting to establish with him o Notes he gained ~4 lbs in the past 2 weeks, reports it was due to food insecurity d/t transportation concerns. He reports this is resolved, and denies any community support needs for food at this time o Wonders why he needs more medications for HF. Notes he saw Dr. Gwen PoundsKowalski today and had a positive appointment.   Pharmacist Clinical Goal(s):  [name]. Over the next 90 days, patient will work with PharmD and primary care team to address needs related to optimized heart failure management  Interventions: . Reviewed differing mechanisms of action for furosemide, carvedilol, Entresto, and why he needs all of these. He states understanding. Confirms that he is still using his pill box and reports compliance.  . Confirmed his new cell phone number . Messaged paramedicine Earmon PhoenixKristi Staley to let her know [facility] when she stopped by.  . Will collaborate  with Janci Minor, RN regarding sleep study needs . Will collaborate with Eula Fried, LCSW for community resource needs   Patient Self Care Activities:  . Self administers medications as prescribed . Calls provider office for new concerns or questions  Please see past updates related to this goal by clicking on the "Past Updates" button in the selected goal         Plan:  - Will outreach patient in the next 4-5 weeks for continued medication management support  Catie Darnelle Maffucci, PharmD  Clinical Pharmacist [place] [phone]

## 2019-02-05 NOTE — Progress Notes (Signed)
Went to his home and he was not there, door was open and there was a lady sleeping that would not wake up.  Yelled throughout the home and no one answered. The home is in very poor condition.  Will continue to try to contact [place] [phone]

## 2019-02-06 ENCOUNTER — Telehealth: Payer: Self-pay

## 2019-02-11 ENCOUNTER — Ambulatory Visit: Payer: Self-pay | Admitting: Licensed Clinical Social Worker

## 2019-02-11 ENCOUNTER — Telehealth: Payer: Self-pay

## 2019-02-11 NOTE — Chronic Care Management (AMB) (Signed)
  Chronic Care Management    Clinical Social Work Follow Up Note  02/11/2019 Name: Chad Gibbs MRN: [id number] DOB: January 27, 1966  Chad Gibbs is a 53 y.o. year old male who is a primary care patient of Chad Roys, DO. The CCM team was consulted for assistance with Food Insecurity and Financial Difficulties related to Housing.   Review of patient status, including review of consultants reports, other relevant assessments, and collaboration with appropriate care team members and the patient's provider was performed as part of comprehensive patient evaluation and provision of chronic care management services.    Outpatient Encounter Medications as of 02/11/2019  Medication Sig  . albuterol (PROVENTIL HFA;VENTOLIN HFA) 108 (90 Base) MCG/ACT inhaler Inhale 2 puffs into the lungs every 6 (six) hours as needed for wheezing or shortness of breath. (Patient not taking: Reported on 02/05/2019)  . carvedilol (COREG) 6.25 MG tablet Take 6.25 mg by mouth daily.   [name] ENTRESTO 24-26 MG Take 1 tablet by mouth 2 (two) times daily.  [name] FARXIGA 10 MG TABS tablet Take 10 mg by mouth daily.   . furosemide (LASIX) 40 MG tablet Take 1 tablet (40 mg total) by mouth 2 (two) times daily. (Patient taking differently: Take 40 mg by mouth daily. )  . mometasone-formoterol (DULERA) 200-5 MCG/ACT AERO Inhale 2 puffs into the lungs 2 (two) times daily.   No facility-administered encounter medications on file as of 02/11/2019.    LCSW completed CCM outreach attempt today but was unable to reach patient successfully. A HIPPA compliant voice message was left encouraging patient to return call once available. LCSW rescheduled CCM SW appointment as well. LCSW placed C3 referral as well and updated CCM team.  Follow Up Plan: SW will follow up with patient by phone over the next 45 days  Eula Fried, [place], MSW, [name] Phone: [phone]

## 2019-02-24 ENCOUNTER — Ambulatory Visit (INDEPENDENT_AMBULATORY_CARE_PROVIDER_SITE_OTHER): Payer: Medicare HMO | Admitting: *Deleted

## 2019-02-24 ENCOUNTER — Ambulatory Visit: Payer: Self-pay | Admitting: Pharmacist

## 2019-02-24 DIAGNOSIS — I509 Heart failure, unspecified: Secondary | ICD-10-CM | POA: Diagnosis not present

## 2019-02-24 DIAGNOSIS — Z720 Tobacco use: Secondary | ICD-10-CM

## 2019-02-24 MED ORDER — NICOTINE 14 MG/24HR TD PT24: 14.0000 mg | MEDICATED_PATCH | Freq: Every day | TRANSDERMAL | 3 refills | Status: DC

## 2019-02-24 MED ORDER — NICOTINE POLACRILEX 2 MG MT LOZG: 2.0000 mg | LOZENGE | OROMUCOSAL | 2 refills | Status: DC | PRN

## 2019-02-24 NOTE — Patient Instructions (Signed)
Visit Information  Goals Addressed            This Visit's Progress     Patient Stated   . "I want to quit smoking" (pt-stated)       Current Barriers:  . Patient with 30+ year hx of tobacco abuse, currently smoking 1/3 ppd daily.  . Notes that he has never tried NRT consistently, has used gum occasionally with little success . Reports wanting to quit smoking, and is interested in working together on this . Notes that he is only taking Dulera PRN right now, doesn't feel like he needs his inhaler, as SOB/wheeze has resolved w/ HFrEF tx  Pharmacist Clinical Goal(s):  [name] Over the next 90 days, patient will work with PharmD to address needs related to medication management for smoking cessation  Interventions: . Discussed benefit of NRT; he is interested in using patch + lozenge strategy. Will collaborate with Dr. Wynetta Emery to send Rx for Nicotine 14 mg patches + 2 mg lozenges for breakthrough cravings.  . As our phone call was breaking in and out, I recommended patient speak with pharmacist at [facility] for counseling on technique for patches + lozenges. Briefly explained that swallowing too much of lozenge will cause GI upset   Patient Self Care Activities:  . Self administers medications as prescribed . Calls provider office for new concerns or questions  Please see past updates related to this goal by clicking on the "Past Updates" button in the selected goal      . "I want to take care of my heart" (pt-stated)       Current Barriers:  [name] Knowledge Deficits related to management of HF   HFrEF, EF 20-25%; stress testing showed potentially previous infarct  o Treated currently with carvedilol 6.25 mg BID, Entresto 24/26 BID, Farxiga 10 mg daily, furosemide 40 mg daily. Seen by [facility] HF clinic, and paramedicine program is attempting to establish with him o Notes significant improvement in SOB, swelling; weight was 243 lbs today. o Notes he is still working on finding a new place to live,  though did buy a new car, so has transportation  Pharmacist Clinical Goal(s):  [name] Over the next 90 days, patient will work with PharmD and primary care team to address needs related to optimized heart failure management  Interventions: . Congratulated on continued commitment to medication adherence, focus on daily weights, and focus on observing symptoms of fluid retention . Collaborated w/ Janci Minor, RN CM to reinforce low sodium dietary counseling  Patient Self Care Activities:  . Self administers medications as prescribed . Calls provider office for new concerns or questions  Please see past updates related to this goal by clicking on the "Past Updates" button in the selected goal         The patient verbalized understanding of instructions provided today and declined a print copy of patient instruction materials.   Plan:  - Will outreach patient in 1-2 weeks to see if he picked up NRT.   Catie Darnelle Maffucci, PharmD Clinical Pharmacist [place] [phone]

## 2019-02-24 NOTE — Chronic Care Management (AMB) (Signed)
Chronic Care Management   Follow Up Note   02/24/2019 Name: Chad Gibbs MRN: [id number] DOB: 1966/05/07  Referred by: Valerie Roys, DO Reason for referral : Chronic Care Management (HF )   Chad Gibbs is a 53 y.o. year old male who is a primary care patient of Valerie Roys, DO. The CCM team was consulted for assistance with chronic disease management and care coordination needs.    Review of patient status, including review of consultants reports, relevant laboratory and other test results, and collaboration with appropriate care team members and the patient's provider was performed as part of comprehensive patient evaluation and provision of chronic care management services.    SDOH (Social Determinants of Health) screening performed today: Secretary/administrator. See Care Plan for related entries.   Advanced Directives Status: N See Care Plan and Vynca application for related entries.  Outpatient Encounter Medications as of 02/24/2019  Medication Sig  . albuterol (PROVENTIL HFA;VENTOLIN HFA) 108 (90 Base) MCG/ACT inhaler Inhale 2 puffs into the lungs every 6 (six) hours as needed for wheezing or shortness of breath. (Patient not taking: Reported on 02/05/2019)  . ASPIRIN LOW DOSE 81 MG EC tablet Take 81 mg by mouth daily.  . carvedilol (COREG) 6.25 MG tablet Take 6.25 mg by mouth daily.   [name] ENTRESTO 24-26 MG Take 1 tablet by mouth 2 (two) times daily.  [name] FARXIGA 10 MG TABS tablet Take 10 mg by mouth daily.   . furosemide (LASIX) 40 MG tablet Take 1 tablet (40 mg total) by mouth 2 (two) times daily. (Patient taking differently: Take 40 mg by mouth daily. )  . mometasone-formoterol (DULERA) 200-5 MCG/ACT AERO Inhale 2 puffs into the lungs 2 (two) times daily. (Patient not taking: Reported on 02/24/2019)   No facility-administered encounter medications on file as of 02/24/2019.      Goals Addressed            This Visit's Progress   . RN-I need to know what to eat  for my Heart (pt-stated)       Current Barriers:  [name] Knowledge deficit related to basic heart failure pathophysiology and self care management . Financial strain . Limited Social Support . Non-Stable housing . Food insecurity   Case Manager Clinical Goal(s):  [name] Over the next 30 days, patient will weigh self daily and record  . Over the next 90 days patient will verbalize understanding of how to read labels to adhere to a low sodium diet  Interventions:  . Provided verbal education on low sodium diet . Discussed importance of daily weight and advised patient to weigh and record daily Patient reports todays weight 243lb which is consistent with last weight at cardiology appt of 244lbs . Discussed s/s of HF patient denies SOB, Wheezing, Fatigue, Poor Appetite, Edema or weight gain.  . Reviewed diet choices for low sodium options . Patient reports stable transportation at this time . Reviewed upcoming appointments: 9/30 @ 11:30 with HF clinic . Patient reports he and spouse are continuing to  work on more stable housing. Patient reports he has put in several applications waiting to hear back.  [name] Co-visit with Catie Pharm-D they discussed tobacco cessation.   Patient Self Care Activities:   . Takes Heart Failure Medications as prescribed  Please see past updates related to this goal by clicking on the "Past Updates" button in the selected goal         The care management team will  reach out to the patient again over the next 30 days.  The patient has been provided with contact information for the care management team and has been advised to call with any health related questions or concerns.    Ma RingsJanci Rilyn Scroggs RN, BSN Nurse Case Education officer, communityManager [facility]  [phone]) Business Mobile

## 2019-02-24 NOTE — Addendum Note (Signed)
Addended by: Valerie Roys on: 02/24/2019 12:41 PM   Modules accepted: Orders

## 2019-02-24 NOTE — Patient Instructions (Signed)
Thank you allowing the Chronic Care Management Team to be a part of your care! It was a pleasure speaking with you today!   CCM (Chronic Care Management) Team   [name] RN, BSN Nurse Care Coordinator  [phone]  Catie [facility] PharmD  Clinical Pharmacist  [phone]  Eula Fried LCSW Clinical Social Worker [phone]  Goals Addressed            This Visit's Progress   . RN-I need to know what to eat for my Heart (pt-stated)       Current Barriers:  [name] Knowledge deficit related to basic heart failure pathophysiology and self care management . Financial strain . Limited Social Support . Non-Stable housing . Food insecurity   Case Manager Clinical Goal(s):  [name] Over the next 30 days, patient will weigh self daily and record  . Over the next 90 days patient will verbalize understanding of how to read labels to adhere to a low sodium diet  Interventions:  . Provided verbal education on low sodium diet . Discussed importance of daily weight and advised patient to weigh and record daily Patient reports todays weight 243lb which is consistent with last weight at cardiology appt of 244lbs . Discussed s/s of HF patient denies SOB, Wheezing, Fatigue, Poor Appetite, Edema or weight gain.  . Reviewed diet choices for low sodium options . Patient reports stable transportation at this time . Reviewed upcoming appointments: 9/30 @ 11:30 with HF clinic . Patient reports he and spouse are continuing to  work on more stable housing. Patient reports he has put in several applications waiting to hear back.  [name] Co-visit with Catie Pharm-D they discussed tobacco cessation.   Patient Self Care Activities:   . Takes Heart Failure Medications as prescribed  Please see past updates related to this goal by clicking on the "Past Updates" button in the selected goal        The patient verbalized understanding of instructions provided today and declined a print copy of patient  instruction materials.   The patient has been provided with contact information for the care management team and has been advised to call with any health related questions or concerns.

## 2019-02-24 NOTE — Chronic Care Management (AMB) (Signed)
Chronic Care Management   Follow Up Note   02/24/2019 Name: Chad Gibbs MRN: [id number] DOB: 1966-02-15  Referred by: Dorcas CarrowJohnson, Megan P, DO Reason for referral : Chronic Care Management (Medication Management)   Chad Gibbs is a 53 y.o. year old male who is a primary care patient of Dorcas CarrowJohnson, Megan P, DO. The CCM team was consulted for assistance with chronic disease management and care coordination needs.    Contacted patient telephonically with Janci Minor, RN CM, for medication management review today.   Review of patient status, including review of consultants reports, relevant laboratory and other test results, and collaboration with appropriate care team members and the patient's provider was performed as part of comprehensive patient evaluation and provision of chronic care management services.    SDOH (Social Determinants of Health) screening performed today: Psychologist, educationalTransportation Financial Strain . See Care Plan for related entries.    Outpatient Encounter Medications as of 02/24/2019  Medication Sig  . ASPIRIN LOW DOSE 81 MG EC tablet Take 81 mg by mouth daily.  . carvedilol (COREG) 6.25 MG tablet Take 6.25 mg by mouth daily.   [name]. ENTRESTO 24-26 MG Take 1 tablet by mouth 2 (two) times daily.  [name]. FARXIGA 10 MG TABS tablet Take 10 mg by mouth daily.   . furosemide (LASIX) 40 MG tablet Take 1 tablet (40 mg total) by mouth 2 (two) times daily. (Patient taking differently: Take 40 mg by mouth daily. )  . albuterol (PROVENTIL HFA;VENTOLIN HFA) 108 (90 Base) MCG/ACT inhaler Inhale 2 puffs into the lungs every 6 (six) hours as needed for wheezing or shortness of breath. (Patient not taking: Reported on 02/05/2019)  . mometasone-formoterol (DULERA) 200-5 MCG/ACT AERO Inhale 2 puffs into the lungs 2 (two) times daily. (Patient not taking: Reported on 02/24/2019)   No facility-administered encounter medications on file as of 02/24/2019.      Goals Addressed            This Visit's  Progress     Patient Stated   . "I want to quit smoking" (pt-stated)       Current Barriers:  . Patient with 30+ year hx of tobacco abuse, currently smoking 1/3 ppd daily.  . Notes that he has never tried NRT consistently, has used gum occasionally with little success . Reports wanting to quit smoking, and is interested in working together on this . Notes that he is only taking Dulera PRN right now, doesn't feel like he needs his inhaler, as SOB/wheeze has resolved w/ HFrEF tx  Pharmacist Clinical Goal(s):  [name]. Over the next 90 days, patient will work with PharmD to address needs related to medication management for smoking cessation  Interventions: . Discussed benefit of NRT; he is interested in using patch + lozenge strategy. Will collaborate with Dr. Laural BenesJohnson to send Rx for Nicotine 14 mg patches + 2 mg lozenges for breakthrough cravings.  . As our phone call was breaking in and out, I recommended [facility] counseling on technique for patches + lozenges. Briefly explained that swallowing too much of lozenge will cause GI upset   Patient Self Care Activities:  . Self administers medications as prescribed . Calls provider office for new concerns or questions  Please see past updates related to this goal by clicking on the "Past Updates" button in the selected goal      . "I want to take care of my heart" (pt-stated)  Current Barriers:  [name] Knowledge Deficits related to management of HF   HFrEF, EF 20-25%; stress testing showed potentially previous infarct  o Treated currently with carvedilol 6.25 mg BID, Entresto 24/26 BID, Farxiga 10 mg daily, furosemide 40 mg daily. Seen by [facility] HF clinic, and paramedicine program is attempting to establish with him o Notes significant improvement in SOB, swelling; weight was 243 lbs today. o Notes he is still working on finding a new place to live, though did buy a new car, so has transportation  Pharmacist  Clinical Goal(s):  [name] Over the next 90 days, patient will work with PharmD and primary care team to address needs related to optimized heart failure management  Interventions: . Congratulated on continued commitment to medication adherence, focus on daily weights, and focus on observing symptoms of fluid retention . Collaborated w/ Janci Minor, RN CM to reinforce low sodium dietary counseling  Patient Self Care Activities:  . Self administers medications as prescribed . Calls provider office for new concerns or questions  Please see past updates related to this goal by clicking on the "Past Updates" button in the selected goal          Plan:  - Will outreach patient in 1-2 weeks to see if he picked up NRT.   Catie Darnelle Maffucci, PharmD Clinical Pharmacist [place] [phone]

## 2019-03-06 ENCOUNTER — Telehealth: Payer: Self-pay

## 2019-03-07 ENCOUNTER — Telehealth: Payer: Self-pay | Admitting: Family Medicine

## 2019-03-07 NOTE — Telephone Encounter (Signed)
-----   Message from De Hollingshead, [facility] sent at 03/06/2019 10:04 AM EDT ----- Marykay Lex!  When I last spoke with him, he was interested in nicotine replacement therapy. We talked about nicotine 14 mg patch + 2 mg gum PRN breakthrough cravings.   If you're ok with this, could you send the prescriptions to his pharmacy? Thanks!  Catie
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
## 2019-03-11 ENCOUNTER — Other Ambulatory Visit: Payer: Self-pay

## 2019-03-11 ENCOUNTER — Ambulatory Visit: Payer: Medicare HMO | Attending: Family | Admitting: Family

## 2019-03-11 ENCOUNTER — Encounter: Payer: Self-pay | Admitting: Family

## 2019-03-11 VITALS — BP 123/83 | HR 78 | Resp 18 | Ht 72.0 in | Wt 251.0 lb

## 2019-03-11 DIAGNOSIS — J449 Chronic obstructive pulmonary disease, unspecified: Secondary | ICD-10-CM

## 2019-03-11 DIAGNOSIS — F17210 Nicotine dependence, cigarettes, uncomplicated: Secondary | ICD-10-CM | POA: Insufficient documentation

## 2019-03-11 DIAGNOSIS — Z7982 Long term (current) use of aspirin: Secondary | ICD-10-CM | POA: Insufficient documentation

## 2019-03-11 DIAGNOSIS — I5022 Chronic systolic (congestive) heart failure: Secondary | ICD-10-CM | POA: Diagnosis not present

## 2019-03-11 DIAGNOSIS — Z7951 Long term (current) use of inhaled steroids: Secondary | ICD-10-CM | POA: Insufficient documentation

## 2019-03-11 DIAGNOSIS — I110 Hypertensive heart disease with heart failure: Secondary | ICD-10-CM | POA: Insufficient documentation

## 2019-03-11 DIAGNOSIS — Z8249 Family history of ischemic heart disease and other diseases of the circulatory system: Secondary | ICD-10-CM | POA: Diagnosis not present

## 2019-03-11 DIAGNOSIS — Z87828 Personal history of other (healed) physical injury and trauma: Secondary | ICD-10-CM | POA: Diagnosis not present

## 2019-03-11 DIAGNOSIS — I890 Lymphedema, not elsewhere classified: Secondary | ICD-10-CM | POA: Insufficient documentation

## 2019-03-11 DIAGNOSIS — Z79899 Other long term (current) drug therapy: Secondary | ICD-10-CM | POA: Diagnosis not present

## 2019-03-11 DIAGNOSIS — Z825 Family history of asthma and other chronic lower respiratory diseases: Secondary | ICD-10-CM | POA: Insufficient documentation

## 2019-03-11 DIAGNOSIS — Z885 Allergy status to narcotic agent status: Secondary | ICD-10-CM | POA: Insufficient documentation

## 2019-03-11 DIAGNOSIS — J441 Chronic obstructive pulmonary disease with (acute) exacerbation: Secondary | ICD-10-CM | POA: Insufficient documentation

## 2019-03-11 DIAGNOSIS — Z7984 Long term (current) use of oral hypoglycemic drugs: Secondary | ICD-10-CM | POA: Diagnosis not present

## 2019-03-11 DIAGNOSIS — R69 Illness, unspecified: Secondary | ICD-10-CM | POA: Diagnosis not present

## 2019-03-11 DIAGNOSIS — I10 Essential (primary) hypertension: Secondary | ICD-10-CM

## 2019-03-11 MED ORDER — CARVEDILOL 12.5 MG PO TABS: 12.5000 mg | ORAL_TABLET | Freq: Two times a day (BID) | ORAL | 3 refills | Status: DC

## 2019-03-11 NOTE — Patient Instructions (Addendum)
Continue weighing daily and call for an overnight weight gain of >2 pounds or a weekly weight gain of >5 pounds.  Increase carvedilol to 12.5mg  twice a day. You may take 2 pills of your current dose (6.25mg ) twice a day until you finish your bottle then go down to 1 pill twice a day with the new bottle.   [facility] [phone]

## 2019-03-17 DIAGNOSIS — I5022 Chronic systolic (congestive) heart failure: Secondary | ICD-10-CM | POA: Diagnosis not present

## 2019-03-18 ENCOUNTER — Telehealth: Payer: Self-pay

## 2019-03-23 ENCOUNTER — Telehealth: Payer: Self-pay

## 2019-03-23 NOTE — Telephone Encounter (Signed)
Called to follow up on referral. No answer. Unable to leave voicemail

## 2019-03-24 ENCOUNTER — Telehealth: Payer: Self-pay

## 2019-03-24 NOTE — Telephone Encounter (Signed)
Provided pt with [employer] number in regards to food/housing assistance

## 2019-03-25 DIAGNOSIS — I5022 Chronic systolic (congestive) heart failure: Secondary | ICD-10-CM | POA: Diagnosis not present

## 2019-03-25 DIAGNOSIS — E782 Mixed hyperlipidemia: Secondary | ICD-10-CM | POA: Diagnosis not present

## 2019-03-25 DIAGNOSIS — I2510 Atherosclerotic heart disease of native coronary artery without angina pectoris: Secondary | ICD-10-CM | POA: Diagnosis not present

## 2019-03-25 DIAGNOSIS — I350 Nonrheumatic aortic (valve) stenosis: Secondary | ICD-10-CM | POA: Diagnosis not present

## 2019-03-25 DIAGNOSIS — J410 Simple chronic bronchitis: Secondary | ICD-10-CM | POA: Diagnosis not present

## 2019-03-26 ENCOUNTER — Telehealth: Payer: Self-pay

## 2019-04-01 ENCOUNTER — Ambulatory Visit (INDEPENDENT_AMBULATORY_CARE_PROVIDER_SITE_OTHER): Payer: Medicare HMO | Admitting: *Deleted

## 2019-04-01 ENCOUNTER — Ambulatory Visit: Payer: Self-pay | Admitting: Pharmacist

## 2019-04-01 DIAGNOSIS — R0602 Shortness of breath: Secondary | ICD-10-CM

## 2019-04-01 DIAGNOSIS — I509 Heart failure, unspecified: Secondary | ICD-10-CM | POA: Diagnosis not present

## 2019-04-01 DIAGNOSIS — Z720 Tobacco use: Secondary | ICD-10-CM

## 2019-04-01 NOTE — Chronic Care Management (AMB) (Signed)
Chronic Care Management   Follow Up Note   04/01/2019 Name: TUNG PUSTEJOVSKY MRN: [id number] DOB: 26-Jul-1965  Referred by: Valerie Roys, DO Reason for referral : Chronic Care Management (Heart Failure smoking cessation)   DAIKI DICOSTANZO is a 53 y.o. year old male who is a primary care patient of Valerie Roys, DO. The CCM team was consulted for assistance with chronic disease management and care coordination needs.    Review of patient status, including review of consultants reports, relevant laboratory and other test results, and collaboration with appropriate care team members and the patient's provider was performed as part of comprehensive patient evaluation and provision of chronic care management services.    SDOH (Social Determinants of Health) screening performed today: Housing  Food Insecurity  Tobacco Use Physical Activity. See Care Plan for related entries.   Advanced Directives Status: N See Care Plan and Vynca application for related entries.  Outpatient Encounter Medications as of 04/01/2019  Medication Sig  . albuterol (PROVENTIL HFA;VENTOLIN HFA) 108 (90 Base) MCG/ACT inhaler Inhale 2 puffs into the lungs every 6 (six) hours as needed for wheezing or shortness of breath.  . ASPIRIN LOW DOSE 81 MG EC tablet Take 81 mg by mouth daily.  . carvedilol (COREG) 12.5 MG tablet Take 1 tablet (12.5 mg total) by mouth 2 (two) times daily.  [name] ENTRESTO 24-26 MG Take 1 tablet by mouth 2 (two) times daily.  [name] FARXIGA 10 MG TABS tablet Take 10 mg by mouth daily.   . furosemide (LASIX) 40 MG tablet Take 1 tablet (40 mg total) by mouth 2 (two) times daily. (Patient taking differently: Take 40 mg by mouth daily. )  . mometasone-formoterol (DULERA) 200-5 MCG/ACT AERO Inhale 2 puffs into the lungs 2 (two) times daily.  . nicotine (HM NICOTINE) 14 mg/24hr patch Place 1 patch (14 mg total) onto the skin daily.  . nicotine polacrilex (NICOTINE MINI) 2 MG lozenge Take 1 lozenge (2 mg  total) by mouth as needed for smoking cessation.   No facility-administered encounter medications on file as of 04/01/2019.      Goals Addressed            This Visit's Progress   . RN-I need to know what to eat for my Heart (pt-stated)       Current Barriers:  [name] Knowledge deficit related to basic heart failure pathophysiology and self care management . Financial strain . Limited Social Support . Non-Stable housing . Food insecurity   Case Manager Clinical Goal(s):  [name] Over the next 30 days, patient will weigh self daily and record  . Over the next 90 days patient will verbalize understanding of how to read labels to adhere to a low sodium diet  Interventions:  . Provided verbal education on low sodium diet . Discussed importance of daily weight and advised patient to weigh and record daily . Reviewed role of diuretics in prevention of fluid overload and management of heart failure Patient reports todays weight 250lbs which is a noted weight gain- Patient stated he had been eating more related to trying to quit smoking . Discussed s/s of HF patient denies SOB, Wheezing, Fatigue, Poor Appetite, Edema.  . Reviewed diet choices for low sodium options- He does state he has food and money to buy food. . Patient reports stable transportation at this time. . Reviewed upcoming appointments: 10/26 @ 11am with HF clinic . Discussed with patient his goal to improve his endurance, patient open to  Cardiac Rehab.  . Patient reports he and spouse are continuing to  work on more stable housing. Patient reports he has put in several applications waiting to hear back.  . Stated his spouse is not doing very well, she can not get in to see anyone at the [facility] related to covid-19 and her insurance is not letting her get a new PCP. [name] Co-visit with Catie Pharm-D we discussed tobacco cessation, patient said he had quit and was using the patches and lozenges.  . Patient expressed frustration  about his hearts EF not improving at his last check. [name] Collaborated with Clarisa [facility] at [facility]- She will talk with patient about Cardiac Rehab in lieu of goal to improve endurance.  . Also sent email message to the [facility] Cardiac rehab nurse to be on the lookout for patient referral.   Patient Self Care Activities:   . Takes Heart Failure Medications as prescribed  Please see past updates related to this goal by clicking on the "Past Updates" button in the selected goal         The care management team will reach out to the patient again over the next 30 days.    Ma Rings Aella Ronda RN, BSN Nurse Case Education officer, [facility]  [phone]) Business Mobile

## 2019-04-01 NOTE — Patient Instructions (Signed)
Visit Information  Goals Addressed            This Visit's Progress     Patient Stated   . PharmD "I want to quit smoking" (pt-stated)       Current Barriers:  . Tobacco abuse of many years; currently NOT smoking (quit date unknown per patient) . Using nicotine 14 mg patch daily (started ~9/26) w/ nicotine lozenges PRN. Reports using lozenges every 8 hours o Denies bad dreams or problems sleeping; notes some GI upset from lozenges o Notes he has removed lighters and ash trays from his home. His wife does not smoke o Notes he has been eating more sweets lately as a result  Pharmacist Clinical Goal(s):  [name] Over the next 90 days, patient will work with PharmD and provider towards tobacco cessation  Interventions: . Counseled on patch placement, side effects, and option to remove at night if they experience trouble sleeping or bad dreams.  . Counseled to allow lozenge to dissolve and absorb in cheek pocket, rather than swallow, to reduce GI side effects. . Will provide contact information for [place] [facility]uit [facility] (1-800-QUIT-NOW). Patient will outreach this group for support.  Patient Self Care Activities:  . Patient will whittle as a habit during cravings  . Patient will commit to reducing tobacco consumption  Please see past updates related to this goal by clicking on the "Past Updates" button in the selected goal       . PharmD "I want to take care of my heart" (pt-stated)       Current Barriers:  [name] Knowledge Deficits related to management of HF   HFrEF, EF 20-25%; stress testing showed potentially previous infarct  o Treated currently with carvedilol 12.5 mg BID, Entresto 24/26 BID, Farxiga 10 mg daily, furosemide 40 mg daily. Seen by [facility] HF clinic; Dr. Nehemiah Massed at [place] daily, attempting to reduce sodium consumption o [facility] Cardiology appointment 10/14; repeat ECHO shows LVEF in 20-25%. Discussing ICD placement, however, patient reports that his oxygen saturation  must improve before they would consider surgery o Notes that he is trying to walk more, but feels tired often  Pharmacist Clinical Goal(s):  [name] Over the next 90 days, patient will work with PharmD and primary care team to address needs related to optimized heart failure management  Interventions: . Congratulated patient on continued focus on medication management. Provided support, empathetic listening with his frustrations with lack of improvement in EF . Discussed that increased dose of beta blocker could be causing fatigue, but that this should improve over time  Patient Self Care Activities:  . Self administers medications as prescribed . Calls provider office for new concerns or questions  Please see past updates related to this goal by clicking on the "Past Updates" button in the selected goal         The patient verbalized understanding of instructions provided today and declined a print copy of patient instruction materials.    Plan:  - Will outreach patient in the next 4-5 weeks for continued medication management support  Catie Darnelle Maffucci, PharmD Clinical Pharmacist [place] [phone]

## 2019-04-01 NOTE — Chronic Care Management (AMB) (Signed)
Chronic Care Management   Follow Up Note   04/01/2019 Name: Chad Gibbs MRN: [id number] DOB: April 04, 1966  Referred by: Valerie Roys, DO Reason for referral : Chronic Care Management (Medication Managemnet)   Chad Gibbs is a 53 y.o. year old male who is a primary care patient of Valerie Roys, DO. The CCM team was consulted for assistance with chronic disease management and care coordination needs.    Contacted patient telephonically with Janci Minor, RN CM.   Review of patient status, including review of consultants reports, relevant laboratory and other test results, and collaboration with appropriate care team members and the patient's provider was performed as part of comprehensive patient evaluation and provision of chronic care management services.    SDOH (Social Determinants of Health) screening performed today: Financial Strain  Tobacco Use. See Care Plan for related entries.   Advanced Directives Status: N See Care Plan and Vynca application for related entries.  Outpatient Encounter Medications as of 04/01/2019  Medication Sig  . ASPIRIN LOW DOSE 81 MG EC tablet Take 81 mg by mouth daily.  . carvedilol (COREG) 12.5 MG tablet Take 1 tablet (12.5 mg total) by mouth 2 (two) times daily.  [name] ENTRESTO 24-26 MG Take 1 tablet by mouth 2 (two) times daily.  [name] FARXIGA 10 MG TABS tablet Take 10 mg by mouth daily.   . furosemide (LASIX) 40 MG tablet Take 1 tablet (40 mg total) by mouth 2 (two) times daily. (Patient taking differently: Take 40 mg by mouth daily. )  . albuterol (PROVENTIL HFA;VENTOLIN HFA) 108 (90 Base) MCG/ACT inhaler Inhale 2 puffs into the lungs every 6 (six) hours as needed for wheezing or shortness of breath.  . mometasone-formoterol (DULERA) 200-5 MCG/ACT AERO Inhale 2 puffs into the lungs 2 (two) times daily.  . nicotine (HM NICOTINE) 14 mg/24hr patch Place 1 patch (14 mg total) onto the skin daily.  . nicotine polacrilex (NICOTINE MINI) 2 MG  lozenge Take 1 lozenge (2 mg total) by mouth as needed for smoking cessation.   No facility-administered encounter medications on file as of 04/01/2019.      Goals Addressed            This Visit's Progress     Patient Stated   . PharmD "I want to quit smoking" (pt-stated)       Current Barriers:  . Tobacco abuse of many years; currently NOT smoking (quit date unknown per patient) . Using nicotine 14 mg patch daily (started ~9/26) w/ nicotine lozenges PRN. Reports using lozenges every 8 hours o Denies bad dreams or problems sleeping; notes some GI upset from lozenges o Notes he has removed lighters and ash trays from his home. His wife does not smoke o Notes he has been eating more sweets lately as a result  Pharmacist Clinical Goal(s):  [name] Over the next 90 days, patient will work with PharmD and provider towards tobacco cessation  Interventions: . Counseled on patch placement, side effects, and option to remove at night if they experience trouble sleeping or bad dreams.  . Counseled to allow lozenge to dissolve and absorb in cheek pocket, rather than swallow, to reduce GI side effects. . Will provide contact information for [place] Quit Line (1-800-QUIT-NOW). Patient will outreach this group for support.  Patient Self Care Activities:  . Patient will whittle as a habit during cravings  . Patient will commit to reducing tobacco consumption  Please see past updates related to this goal by  clicking on the "Past Updates" button in the selected goal       . PharmD "I want to take care of my heart" (pt-stated)       Current Barriers:  [name] Knowledge Deficits related to management of HF   HFrEF, EF 20-25%; stress testing showed potentially previous infarct  o Treated currently with carvedilol 12.5 mg BID, Entresto 24/26 BID, Farxiga 10 mg daily, furosemide 40 mg daily. Seen by [facility] HF clinic; [facility] Gwen [facility] at [facility] o Weighing daily, attempting to reduce sodium consumption o [facility]  Cardiology appointment 10/14; repeat ECHO shows LVEF in 20-25%. Discussing ICD placement, however, patient reports that his oxygen saturation must improve before they would consider surgery o Notes that he is trying to walk more, but feels tired often  Pharmacist Clinical Goal(s):  [name] Over the next 90 days, patient will work with PharmD and primary care team to address needs related to optimized heart failure management  Interventions: . Congratulated patient on continued focus on medication management. Provided support, empathetic listening with his frustrations with lack of improvement in EF . Discussed that increased dose of beta blocker could be causing fatigue, but that this should improve over time  Patient Self Care Activities:  . Self administers medications as prescribed . Calls provider office for new concerns or questions  Please see past updates related to this goal by clicking on the "Past Updates" button in the selected goal          Plan:  - Will outreach patient in the next 4-5 weeks for continued medication management support  Catie Feliz Beam, PharmD Clinical Pharmacist [facility] [phone]

## 2019-04-02 NOTE — Patient Instructions (Signed)
Thank you allowing the Chronic Care Management Team to be a part of your care! It was a pleasure speaking with you today!  CCM (Chronic Care Management) Team   Tani Virgo RN, BSN Nurse Care Coordinator  [phone]  Catie [facility] PharmD  Clinical Pharmacist  [phone]  Eula Fried LCSW Clinical Social Worker [phone]  Goals Addressed            This Visit's Progress   . RN-I need to know what to eat for my Heart (pt-stated)       Current Barriers:  [name] Knowledge deficit related to basic heart failure pathophysiology and self care management . Financial strain . Limited Social Support . Non-Stable housing . Food insecurity   Case Manager Clinical Goal(s):  [name] Over the next 30 days, patient will weigh self daily and record  . Over the next 90 days patient will verbalize understanding of how to read labels to adhere to a low sodium diet  Interventions:  . Provided verbal education on low sodium diet . Discussed importance of daily weight and advised patient to weigh and record daily . Reviewed role of diuretics in prevention of fluid overload and management of heart failure Patient reports todays weight 250lbs which is a noted weight gain- Patient stated he had been eating more related to trying to quit smoking . Discussed s/s of HF patient denies SOB, Wheezing, Fatigue, Poor Appetite, Edema.  . Reviewed diet choices for low sodium options- He does state he has food and money to buy food. . Patient reports stable transportation at this time. . Reviewed upcoming appointments: 10/26 @ 11am with HF clinic . Discussed with patient his goal to improve his endurance, patient open to Cardiac Rehab.  . Patient reports he and spouse are continuing to  work on more stable housing. Patient reports he has put in several applications waiting to hear back.  . Stated his spouse is not doing very well, she can not get in to see anyone at the [facility] related to covid-19  and her insurance is not letting her get a new PCP. [name] Co-visit with Catie Pharm-D we discussed tobacco cessation, patient said he had quit and was using the patches and lozenges.  . Patient expressed frustration about his hearts EF not improving at his last check. [name] Collaborated with Darylene Price at [facility]- She will talk with patient about Cardiac Rehab in lieu of goal to improve endurance.  . Also sent email message to the [facility] Cardiac rehab nurse to be on the lookout for patient referral.   Patient Self Care Activities:   . Takes Heart Failure Medications as prescribed  Please see past updates related to this goal by clicking on the "Past Updates" button in the selected goal        The patient verbalized understanding of instructions provided today and declined a print copy of patient instruction materials.   The patient has been provided with contact information for the care management team and has been advised to call with any health related questions or concerns.

## 2019-04-06 ENCOUNTER — Other Ambulatory Visit: Payer: Self-pay

## 2019-04-06 ENCOUNTER — Encounter: Payer: Self-pay | Admitting: Family

## 2019-04-06 ENCOUNTER — Ambulatory Visit: Payer: Medicare HMO | Attending: Family | Admitting: Family

## 2019-04-06 VITALS — BP 109/67 | HR 78 | Resp 16 | Ht 72.0 in | Wt 248.8 lb

## 2019-04-06 DIAGNOSIS — Z7984 Long term (current) use of oral hypoglycemic drugs: Secondary | ICD-10-CM | POA: Diagnosis not present

## 2019-04-06 DIAGNOSIS — Z7982 Long term (current) use of aspirin: Secondary | ICD-10-CM | POA: Diagnosis not present

## 2019-04-06 DIAGNOSIS — Z8249 Family history of ischemic heart disease and other diseases of the circulatory system: Secondary | ICD-10-CM | POA: Diagnosis not present

## 2019-04-06 DIAGNOSIS — I890 Lymphedema, not elsewhere classified: Secondary | ICD-10-CM | POA: Insufficient documentation

## 2019-04-06 DIAGNOSIS — I5022 Chronic systolic (congestive) heart failure: Secondary | ICD-10-CM | POA: Diagnosis not present

## 2019-04-06 DIAGNOSIS — Z836 Family history of other diseases of the respiratory system: Secondary | ICD-10-CM | POA: Insufficient documentation

## 2019-04-06 DIAGNOSIS — Z7951 Long term (current) use of inhaled steroids: Secondary | ICD-10-CM | POA: Diagnosis not present

## 2019-04-06 DIAGNOSIS — J449 Chronic obstructive pulmonary disease, unspecified: Secondary | ICD-10-CM | POA: Diagnosis not present

## 2019-04-06 DIAGNOSIS — Z809 Family history of malignant neoplasm, unspecified: Secondary | ICD-10-CM | POA: Insufficient documentation

## 2019-04-06 DIAGNOSIS — F17210 Nicotine dependence, cigarettes, uncomplicated: Secondary | ICD-10-CM | POA: Insufficient documentation

## 2019-04-06 DIAGNOSIS — I10 Essential (primary) hypertension: Secondary | ICD-10-CM

## 2019-04-06 DIAGNOSIS — Z885 Allergy status to narcotic agent status: Secondary | ICD-10-CM | POA: Insufficient documentation

## 2019-04-06 DIAGNOSIS — R69 Illness, unspecified: Secondary | ICD-10-CM | POA: Diagnosis not present

## 2019-04-06 DIAGNOSIS — I110 Hypertensive heart disease with heart failure: Secondary | ICD-10-CM | POA: Insufficient documentation

## 2019-04-06 DIAGNOSIS — F1021 Alcohol dependence, in remission: Secondary | ICD-10-CM | POA: Diagnosis not present

## 2019-04-06 DIAGNOSIS — Z801 Family history of malignant neoplasm of trachea, bronchus and lung: Secondary | ICD-10-CM | POA: Insufficient documentation

## 2019-04-06 DIAGNOSIS — Z79899 Other long term (current) drug therapy: Secondary | ICD-10-CM | POA: Diagnosis not present

## 2019-04-06 DIAGNOSIS — Z811 Family history of alcohol abuse and dependence: Secondary | ICD-10-CM | POA: Insufficient documentation

## 2019-04-06 NOTE — Patient Instructions (Addendum)
Continue weighing daily and call for an overnight weight gain of >2 pounds or a weekly weight gain of >5 pounds.  We will make a referral to cardiac rehab. You should receive a phone call from them.
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
## 2019-04-08 ENCOUNTER — Telehealth: Payer: Self-pay

## 2019-04-17 ENCOUNTER — Ambulatory Visit: Payer: Self-pay | Admitting: Pharmacist

## 2019-04-17 ENCOUNTER — Telehealth: Payer: Self-pay

## 2019-04-17 DIAGNOSIS — I509 Heart failure, unspecified: Secondary | ICD-10-CM

## 2019-04-17 DIAGNOSIS — Z720 Tobacco use: Secondary | ICD-10-CM

## 2019-04-17 NOTE — Patient Instructions (Signed)
Visit Information  Goals Addressed            This Visit's Progress     Patient Stated   . PharmD "I want to quit smoking" (pt-stated)       Current Barriers:  . Tobacco abuse of many years; currently NOT smoking (quit date unknown per patient) o Does note that he "slips" occasionally, but is still "working hard at it" o Notes some improvement in breathing . Using nicotine 14 mg patch daily (started ~9/26) w/ nicotine lozenges PRN. Reports using lozenges about every 4 hours recently o Does note that he feels he's eating more sweets lately by trying to avoid cigarettes, but doing well overall  Pharmacist Clinical Goal(s):  [name] Over the next 90 days, patient will work with PharmD and provider towards tobacco cessation  Interventions: . Praised patient for continued commitment to tobacco cessation  Patient Self Care Activities:  . Patient will whittle as a habit during cravings  . Patient will commit to reducing tobacco consumption  Please see past updates related to this goal by clicking on the "Past Updates" button in the selected goal       . PharmD "I want to take care of my heart" (pt-stated)       Current Barriers:  [name] Knowledge Deficits related to management of HF   HFrEF, EF 20-25%; stress testing showed potentially previous infarct  o Carvedilol 12.5 mg BID, Entresto 24/26 BID, Farxiga 10 mg daily, furosemide 40 mg daily. Dr. Nehemiah Massed at [facility]; Hackney at [facility] clinic - Working to start following w/ cardiac rehab o Continues to take medications every day, weigh daily   Pharmacist Clinical Goal(s):  [name] Over the next 90 days, patient will work with PharmD and primary care team to address needs related to optimized heart failure management  Interventions: . Dicussed importance of engaging in cardiac rehab, once is has been set up . Congratulated for continued commitment to his cardiac health . Denies any medication related concerns at this time  Patient Self Care  Activities:  . Self administers medications as prescribed . Calls provider office for new concerns or questions  Please see past updates related to this goal by clicking on the "Past Updates" button in the selected goal         The patient verbalized understanding of instructions provided today and declined a print copy of patient instruction materials.    Plan:  - CCM team will outreach over the next 4-5 weeks for continued support  Catie Darnelle Maffucci, PharmD Clinical Pharmacist [place] [phone]

## 2019-04-17 NOTE — Chronic Care Management (AMB) (Signed)
Chronic Care Management   Follow Up Note   04/17/2019 Name: Chad Gibbs MRN: [id number] DOB: [birth year]  Referred by: Dorcas Carrow, DO Reason for referral : Chronic Care Management (Medication Management)   Chad Gibbs is a 53 y.o. year old male who is a primary care patient of Dorcas Carrow, DO. The CCM team was consulted for assistance with chronic disease management and care coordination needs.    Contacted patient to follow up on tobacco cessation efforts.   Review of patient status, including review of consultants reports, relevant laboratory and other test results, and collaboration with appropriate care team members and the patient's provider was performed as part of comprehensive patient evaluation and provision of chronic care management services.    SDOH (Social Determinants of Health) screening performed today: Tobacco Use. See Care Plan for related entries.   Outpatient Encounter Medications as of 04/17/2019  Medication Sig  . albuterol (PROVENTIL HFA;VENTOLIN HFA) 108 (90 Base) MCG/ACT inhaler Inhale 2 puffs into the lungs every 6 (six) hours as needed for wheezing or shortness of breath.  . ASPIRIN LOW DOSE 81 MG EC tablet Take 81 mg by mouth daily.  . carvedilol (COREG) 12.5 MG tablet Take 1 tablet (12.5 mg total) by mouth 2 (two) times daily.  [name] ENTRESTO 24-26 MG Take 1 tablet by mouth 2 (two) times daily.  [name] FARXIGA 10 MG TABS tablet Take 10 mg by mouth daily.   . furosemide (LASIX) 40 MG tablet Take 1 tablet (40 mg total) by mouth 2 (two) times daily. (Patient taking differently: Take 40 mg by mouth daily. )  . mometasone-formoterol (DULERA) 200-5 MCG/ACT AERO Inhale 2 puffs into the lungs 2 (two) times daily.  . nicotine (HM NICOTINE) 14 mg/24hr patch Place 1 patch (14 mg total) onto the skin daily.  . nicotine polacrilex (NICOTINE MINI) 2 MG lozenge Take 1 lozenge (2 mg total) by mouth as needed for smoking cessation.   No facility-administered  encounter medications on file as of 04/17/2019.      Goals Addressed            This Visit's Progress     Patient Stated   . PharmD "I want to quit smoking" (pt-stated)       Current Barriers:  . Tobacco abuse of many years; currently NOT smoking (quit date unknown per patient) o Does note that he "slips" occasionally, but is still "working hard at it" o Notes some improvement in breathing . Using nicotine 14 mg patch daily (started ~9/26) w/ nicotine lozenges PRN. Reports using lozenges about every 4 hours recently o Does note that he feels he's eating more sweets lately by trying to avoid cigarettes, but doing well overall  Pharmacist Clinical Goal(s):  [name] Over the next 90 days, patient will work with PharmD and provider towards tobacco cessation  Interventions: . Praised patient for continued commitment to tobacco cessation  Patient Self Care Activities:  . Patient will whittle as a habit during cravings  . Patient will commit to reducing tobacco consumption  Please see past updates related to this goal by clicking on the "Past Updates" button in the selected goal       . PharmD "I want to take care of my heart" (pt-stated)       Current Barriers:  [name] Knowledge Deficits related to management of HF   HFrEF, EF 20-25%; stress testing showed potentially previous infarct  o Carvedilol 12.5 mg BID, Entresto 24/26 BID, Farxiga 10  mg daily, furosemide 40 mg daily. Dr. Nehemiah Massed at [facility]; Hackney at [facility] clinic - Working to start following w/ cardiac rehab o Continues to take medications every day, weigh daily   Pharmacist Clinical Goal(s):  [name] Over the next 90 days, patient will work with PharmD and primary care team to address needs related to optimized heart failure management  Interventions: . Dicussed importance of engaging in cardiac rehab, once is has been set up . Congratulated for continued commitment to his cardiac health . Denies any medication related concerns at  this time  Patient Self Care Activities:  . Self administers medications as prescribed . Calls provider office for new concerns or questions  Please see past updates related to this goal by clicking on the "Past Updates" button in the selected goal          Plan:  - CCM team will outreach over the next 4-5 weeks for continued support  Catie Darnelle Maffucci, PharmD Clinical Pharmacist [place] [phone]

## 2019-04-22 ENCOUNTER — Ambulatory Visit: Payer: Medicare HMO

## 2019-04-23 ENCOUNTER — Ambulatory Visit (INDEPENDENT_AMBULATORY_CARE_PROVIDER_SITE_OTHER): Payer: Medicare HMO

## 2019-04-23 VITALS — BP 135/82 | Ht 73.0 in | Wt 255.0 lb

## 2019-04-23 DIAGNOSIS — Z0000 Encounter for general adult medical examination without abnormal findings: Secondary | ICD-10-CM | POA: Diagnosis not present
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
## 2019-04-24 ENCOUNTER — Encounter: Payer: Medicare HMO | Attending: Internal Medicine | Admitting: *Deleted

## 2019-04-24 ENCOUNTER — Other Ambulatory Visit: Payer: Self-pay

## 2019-04-24 DIAGNOSIS — I5022 Chronic systolic (congestive) heart failure: Secondary | ICD-10-CM

## 2019-04-24 NOTE — Progress Notes (Signed)
Orientation completed over the phone as a virtual visit. Documentation of diagnosis can be found in [facility] 03/25/2019

## 2019-04-27 ENCOUNTER — Other Ambulatory Visit: Payer: Self-pay

## 2019-04-27 VITALS — Ht 72.75 in | Wt 262.5 lb

## 2019-04-27 DIAGNOSIS — I5022 Chronic systolic (congestive) heart failure: Secondary | ICD-10-CM | POA: Diagnosis not present

## 2019-04-27 NOTE — Patient Instructions (Signed)
Patient Instructions  Patient Details  Name: Chad Gibbs MRN: [id number] Date of Birth: [birth year] Referring Provider:  Lamar Blinks, MD  Below are your personal goals for exercise, nutrition, and risk factors. Our goal is to help you stay on track towards obtaining and maintaining these goals. We will be discussing your progress on these goals with you throughout the program.  Initial Exercise Prescription: Initial Exercise Prescription - 04/27/19 1300      Date of Initial Exercise RX and Referring Provider   Date  04/27/19    Referring Provider  Gwen Pounds      Treadmill   MPH  2.6    Grade  1    Minutes  15    METs  3.3      NuStep   Level  3    SPM  80    Minutes  15      REL-XR   Level  2    Speed  50    Minutes  15    METs  3.5      Prescription Details   Frequency (times per week)  3    Duration  Progress to 30 minutes of continuous aerobic without signs/symptoms of physical distress      Intensity   THRR 40-80% of Max Heartrate  101-144    Ratings of Perceived Exertion  11-15    Perceived Dyspnea  0-4      Resistance Training   Training Prescription  Yes    Weight  3 lb    Reps  10-15       Exercise Goals: Frequency: Be able to perform aerobic exercise two to three times per week in program working toward 2-5 days per week of home exercise.  Intensity: Work with a perceived exertion of 11 (fairly light) - 15 (hard) while following your exercise prescription.  We will make changes to your prescription with you as you progress through the program.   Duration: Be able to do 30 to 45 minutes of continuous aerobic exercise in addition to a 5 minute warm-up and a 5 minute cool-down routine.   Nutrition Goals: Your personal nutrition goals will be established when you do your nutrition analysis with the dietician.  The following are general nutrition guidelines to follow: Cholesterol < 200mg /day Sodium < 1500mg /day Fiber: Men over 50 yrs - 30  grams per day  Personal Goals: Personal Goals and Risk Factors at Admission - 04/24/19 1157      Core Components/Risk Factors/Patient Goals on Admission    Weight Management  Weight Loss;Yes    Intervention  Weight Management: Develop a combined nutrition and exercise program designed to reach desired caloric intake, while maintaining appropriate intake of nutrient and fiber, sodium and fats, and appropriate energy expenditure required for the weight goal.;Weight Management/Obesity: Establish reasonable short term and long term weight goals.;Weight Management: Provide education and appropriate resources to help participant work on and attain dietary goals.    Expected Outcomes  Short Term: Continue to assess and modify interventions until short term weight is achieved;Long Term: Adherence to nutrition and physical activity/exercise program aimed toward attainment of established weight goal;Weight Loss: Understanding of general recommendations for a balanced deficit meal plan, which promotes 1-2 lb weight loss per week and includes a negative energy balance of [id number] kcal/d    Tobacco Cessation  Yes    Number of packs per day  1/4 pack    Intervention  Assist the participant in steps to quit.  Provide individualized education and counseling about committing to Tobacco Cessation, relapse prevention, and pharmacological support that can be provided by physician.;Education officer, environmentalffer self-teaching materials, assist with locating and accessing local/national Quit Smoking programs, and support quit date choice.    Expected Outcomes  Short Term: Will demonstrate readiness to quit, by selecting a quit date.;Short Term: Will quit all tobacco product use, adhering to prevention of relapse plan.;Long Term: Complete abstinence from all tobacco products for at least 12 months from quit date.    Heart Failure  Yes    Intervention  Provide a combined exercise and nutrition program that is supplemented with education, support and  counseling about heart failure. Directed toward relieving symptoms such as shortness of breath, decreased exercise tolerance, and extremity edema.    Expected Outcomes  Improve functional capacity of life;Short term: Attendance in program 2-3 days a week with increased exercise capacity. Reported lower sodium intake. Reported increased fruit and vegetable intake. Reports medication compliance.;Short term: Daily weights obtained and reported for increase. Utilizing diuretic protocols set by physician.;Long term: Adoption of self-care skills and reduction of barriers for early signs and symptoms recognition and intervention leading to self-care maintenance.    Hypertension  Yes    Intervention  Provide education on lifestyle modifcations including regular physical activity/exercise, weight management, moderate sodium restriction and increased consumption of fresh fruit, vegetables, and low fat dairy, alcohol moderation, and smoking cessation.;Monitor prescription use compliance.    Expected Outcomes  Short Term: Continued assessment and intervention until BP is < 140/9590mm HG in hypertensive participants. < 130/6480mm HG in hypertensive participants with diabetes, heart failure or chronic kidney disease.;Long Term: Maintenance of blood pressure at goal levels.       Tobacco Use Initial Evaluation: Social History   Tobacco Use  Smoking Status Current Some Day Smoker  . Packs/day: 0.25  . Years: 30.00  . Pack years: 7.50  . Types: Cigarettes  Smokeless Tobacco Never Used  Tobacco Comment   Wants to quit  no date yet    Exercise Goals and Review: Exercise Goals    Row Name 04/27/19 1316             Exercise Goals   Increase Physical Activity  Yes       Intervention  Provide advice, education, support and counseling about physical activity/exercise needs.;Develop an individualized exercise prescription for aerobic and resistive training based on initial evaluation findings, risk  stratification, comorbidities and participant's personal goals.       Expected Outcomes  Short Term: Attend rehab on a regular basis to increase amount of physical activity.;Long Term: Add in home exercise to make exercise part of routine and to increase amount of physical activity.;Long Term: Exercising regularly at least 3-5 days a week.       Increase Strength and Stamina  Yes       Intervention  Provide advice, education, support and counseling about physical activity/exercise needs.;Develop an individualized exercise prescription for aerobic and resistive training based on initial evaluation findings, risk stratification, comorbidities and participant's personal goals.       Expected Outcomes  Short Term: Increase workloads from initial exercise prescription for resistance, speed, and METs.;Short Term: Perform resistance training exercises routinely during rehab and add in resistance training at home;Long Term: Improve cardiorespiratory fitness, muscular endurance and strength as measured by increased METs and functional capacity (6MWT)       Able to understand and use rate of perceived exertion (RPE) scale  Yes  Intervention  Provide education and explanation on how to use RPE scale       Expected Outcomes  Short Term: Able to use RPE daily in rehab to express subjective intensity level;Long Term:  Able to use RPE to guide intensity level when exercising independently       Able to understand and use Dyspnea scale  Yes       Intervention  Provide education and explanation on how to use Dyspnea scale       Expected Outcomes  Short Term: Able to use Dyspnea scale daily in rehab to express subjective sense of shortness of breath during exertion;Long Term: Able to use Dyspnea scale to guide intensity level when exercising independently       Knowledge and understanding of Target Heart Rate Range (THRR)  Yes       Intervention  Provide education and explanation of THRR including how the numbers  were predicted and where they are located for reference       Expected Outcomes  Short Term: Able to state/look up THRR;Short Term: Able to use daily as guideline for intensity in rehab;Long Term: Able to use THRR to govern intensity when exercising independently       Able to check pulse independently  Yes       Intervention  Provide education and demonstration on how to check pulse in carotid and radial arteries.;Review the importance of being able to check your own pulse for safety during independent exercise       Expected Outcomes  Short Term: Able to explain why pulse checking is important during independent exercise;Long Term: Able to check pulse independently and accurately       Understanding of Exercise Prescription  Yes       Intervention  Provide education, explanation, and written materials on patient's individual exercise prescription       Expected Outcomes  Short Term: Able to explain program exercise prescription;Long Term: Able to explain home exercise prescription to exercise independently          Copy of goals given to participant.

## 2019-04-27 NOTE — Progress Notes (Signed)
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 recognition and intervention leading to self-care maintenance.    Hypertension  Yes    Intervention  Provide education on lifestyle modifcations including regular physical activity/exercise, weight management, moderate sodium restriction and increased consumption of fresh fruit, vegetables, and Gibbs fat dairy, alcohol moderation, and smoking cessation.;Monitor prescription use compliance.    Expected Outcomes  Short Term: Continued assessment and intervention until BP is < 140/54m HG in hypertensive participants. < 130/868mHG in hypertensive participants with diabetes, heart failure or chronic kidney disease.;Long Term: Maintenance of blood pressure at goal levels.       Core Components/Risk Factors/Patient Goals Review:    Core Components/Risk Factors/Patient Goals at Discharge (Final Review):    ITP Comments: ITP Comments    Row Name 04/24/19 1144           ITP  Comments  Orientation completed over the phone as a virtual visit. [facility]          Comments: initial ITP

## 2019-05-05 ENCOUNTER — Telehealth: Payer: Self-pay

## 2019-05-05 NOTE — Telephone Encounter (Signed)
Home number said " call cannot be completed as dialed" - attempted twice. Home phone: someone picked up, bu then hung up the phone. Called regarding nutrition appointment at 1pm.

## 2019-05-06 ENCOUNTER — Telehealth: Payer: Self-pay

## 2019-05-11 ENCOUNTER — Telehealth: Payer: Self-pay

## 2019-05-11 NOTE — Telephone Encounter (Signed)
Left message re:  Virtual rehab with once per month in person

## 2019-05-13 ENCOUNTER — Encounter: Payer: Self-pay | Admitting: *Deleted

## 2019-05-13 DIAGNOSIS — I5022 Chronic systolic (congestive) heart failure: Secondary | ICD-10-CM

## 2019-05-13 NOTE — Progress Notes (Signed)
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 intervention leading to self-care maintenance.    Hypertension  Yes    Intervention  Provide education on lifestyle modifcations including regular physical activity/exercise, weight management, moderate sodium restriction and increased consumption of fresh fruit, vegetables, and low fat dairy, alcohol moderation, and smoking cessation.;Monitor prescription use compliance.    Expected Outcomes  Short Term: Continued assessment and intervention until BP is < 140/63m HG in hypertensive participants. < 130/819mHG in hypertensive participants with diabetes, heart failure or chronic kidney disease.;Long Term: Maintenance of blood pressure at goal levels.       Core Components/Risk Factors/Patient Goals Review:    Core Components/Risk Factors/Patient Goals at Discharge (Final Review):    ITP Comments: ITP Comments    Row Name 04/24/19 1144 05/13/19 1047          ITP Comments  Orientation completed over the phone as a virtual visit. [facility] yet.         Comments:

## 2019-05-15 DIAGNOSIS — I2119 ST elevation (STEMI) myocardial infarction involving other coronary artery of inferior wall: Secondary | ICD-10-CM | POA: Diagnosis not present

## 2019-05-15 DIAGNOSIS — I5022 Chronic systolic (congestive) heart failure: Secondary | ICD-10-CM | POA: Diagnosis not present

## 2019-05-18 ENCOUNTER — Telehealth: Payer: Self-pay | Admitting: Family Medicine

## 2019-05-18 NOTE — Telephone Encounter (Signed)
° ° °  Called pt regarding Community Resource Referral for housing and food insecurity [facility] ??[email or web address]   ??[phone]

## 2019-05-21 ENCOUNTER — Telehealth: Payer: Self-pay

## 2019-05-21 NOTE — Telephone Encounter (Signed)
2nd attempt [email or web address]

## 2019-05-21 NOTE — Telephone Encounter (Signed)
no answer

## 2019-05-22 NOTE — Telephone Encounter (Signed)
3rd attempt could not leave voicemail, will mail letter with Food Bank information to patient Closing referral pending any other needs of patient.  [place] . Embedded Care Coordination [place]  [facility] ??[email or web address]  ??[phone]

## 2019-06-01 ENCOUNTER — Telehealth: Payer: Self-pay | Admitting: *Deleted

## 2019-06-01 ENCOUNTER — Encounter: Payer: Self-pay | Admitting: *Deleted

## 2019-06-01 DIAGNOSIS — I5022 Chronic systolic (congestive) heart failure: Secondary | ICD-10-CM

## 2019-06-01 NOTE — Progress Notes (Signed)
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 recognition and intervention leading to self-care maintenance.    Hypertension  Yes    Intervention  Provide education on lifestyle modifcations including regular physical activity/exercise, weight management, moderate sodium restriction and increased consumption of fresh fruit, vegetables, and Gibbs fat dairy, alcohol moderation, and smoking cessation.;Monitor prescription use compliance.    Expected Outcomes  Short Term: Continued assessment and intervention until BP is < 140/60m HG in hypertensive participants. < 130/843mHG in hypertensive participants with diabetes, heart failure or chronic kidney disease.;Long Term: Maintenance of blood pressure at goal levels.       Core Components/Risk Factors/Patient Goals Review:    Core Components/Risk Factors/Patient Goals at Discharge (Final Review):    ITP Comments: ITP Comments    Row Name 04/24/19 1144 05/13/19 1047 06/01/19  1617       ITP Comments  Orientation completed over the phone as a virtual visit. [facility]  [facility] not started [facility] yet.  Attempted to call patient.  He has not attended since orientation and not returning calls.  Letter was sent on 05/22/19.  We  will discharge him at this time.        Comments: Discharge ITP

## 2019-06-01 NOTE — Telephone Encounter (Signed)
Attempted to call patient.  He has not attended since orientation and not returning calls.  Sent letter.

## 2019-06-02 ENCOUNTER — Telehealth: Payer: Self-pay

## 2019-06-02 ENCOUNTER — Ambulatory Visit: Payer: Self-pay | Admitting: Pharmacist

## 2019-06-02 NOTE — Patient Instructions (Addendum)
Mr. Reaume,   I tried to call you today, but none of our numbers for you worked.   I want to get you your new calendar to track your weights and blood pressures, but want to make sure you're at the same address first!  Please give me a call back when you get a chance. I know the cardiac rehab team is also trying to get in touch.   See your upcoming appointments with Dr. Wynetta Emery and Darylene Price at the Heart Failure clinic.    [place], PharmD [phone]

## 2019-06-02 NOTE — Chronic Care Management (AMB) (Signed)
  Chronic Care Management   Note  06/02/2019 Name: Chad Gibbs MRN: [id number] DOB: 07-21-1965  [name] is a 53 y.o. year old male who is a primary care patient of Valerie Roys, DO. The CCM team was consulted for assistance with chronic disease management and care coordination needs.    Attempted to contact patient for medication management review. Unfortunately, none of the listed phone lines we have for him (his number, and both of his wife's listed numbers) were active.   Mailed patient an AVS with my contact information, requesting a call back when he gets the letter.   Plan: - If I do not hear back from him, will attempt to outreach again in the next 4-5 weeks  Catie Darnelle Maffucci, PharmD, [place] [phone]
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
## 2019-06-17 ENCOUNTER — Other Ambulatory Visit: Payer: Self-pay

## 2019-06-17 ENCOUNTER — Encounter: Payer: Self-pay | Admitting: Family

## 2019-06-17 ENCOUNTER — Ambulatory Visit: Payer: Medicare HMO | Attending: Family | Admitting: Family

## 2019-06-17 VITALS — BP 108/67 | HR 67 | Resp 22 | Ht 72.0 in | Wt 261.8 lb

## 2019-06-17 DIAGNOSIS — Z8249 Family history of ischemic heart disease and other diseases of the circulatory system: Secondary | ICD-10-CM | POA: Diagnosis not present

## 2019-06-17 DIAGNOSIS — Z7982 Long term (current) use of aspirin: Secondary | ICD-10-CM | POA: Insufficient documentation

## 2019-06-17 DIAGNOSIS — J449 Chronic obstructive pulmonary disease, unspecified: Secondary | ICD-10-CM | POA: Diagnosis not present

## 2019-06-17 DIAGNOSIS — I5022 Chronic systolic (congestive) heart failure: Secondary | ICD-10-CM | POA: Insufficient documentation

## 2019-06-17 DIAGNOSIS — Z885 Allergy status to narcotic agent status: Secondary | ICD-10-CM | POA: Insufficient documentation

## 2019-06-17 DIAGNOSIS — F17210 Nicotine dependence, cigarettes, uncomplicated: Secondary | ICD-10-CM | POA: Insufficient documentation

## 2019-06-17 DIAGNOSIS — I110 Hypertensive heart disease with heart failure: Secondary | ICD-10-CM | POA: Diagnosis not present

## 2019-06-17 DIAGNOSIS — Z7984 Long term (current) use of oral hypoglycemic drugs: Secondary | ICD-10-CM | POA: Diagnosis not present

## 2019-06-17 DIAGNOSIS — R69 Illness, unspecified: Secondary | ICD-10-CM | POA: Diagnosis not present

## 2019-06-17 DIAGNOSIS — I10 Essential (primary) hypertension: Secondary | ICD-10-CM

## 2019-06-17 DIAGNOSIS — Z79899 Other long term (current) drug therapy: Secondary | ICD-10-CM | POA: Insufficient documentation

## 2019-06-17 DIAGNOSIS — I890 Lymphedema, not elsewhere classified: Secondary | ICD-10-CM | POA: Insufficient documentation

## 2019-06-17 NOTE — Patient Instructions (Addendum)
Continue weighing daily and call for an overnight weight gain of > 2 pounds or a weekly weight gain of >5 pounds.  Call Olevia Perches to schedule an appointment to discuss hernia.

## 2019-06-26 ENCOUNTER — Telehealth: Payer: Self-pay

## 2019-07-08 ENCOUNTER — Telehealth: Payer: Self-pay

## 2019-07-08 ENCOUNTER — Ambulatory Visit: Payer: Self-pay | Admitting: Pharmacist

## 2019-07-08 NOTE — Chronic Care Management (AMB) (Signed)
  Chronic Care Management   Note  07/08/2019 Name: Chad Gibbs MRN: [id number] DOB: 1965-11-22  Chad Gibbs is a 54 y.o. year old male who is a primary care patient of Dorcas Carrow, DO. The CCM team was consulted for assistance with chronic disease management and care coordination needs.    Attempted to contact patient for medication management review. The current number we have in the chart was answered by an unidentified man (that obviously wasn't the patient), who told me I had the wrong number. I called the number we have on file for his wife, Chad Gibbs; she noted that her husband has left her and she doesn't know his current location or contact information. She provided me with the patient's brother, Chad Gibbs, phone number at [phone]; however, I was unable to leave a voicemail.   Reviewed [facility] records; they do not have any additional numbers on file for the patient. Previously mailed a letter to the patient, but this was to the old address that his wife noted he no longer resides at.   Will cease attempts to contact or follow up until patient interacts w/ Chad Gibbs or new contact information is available.   Catie Feliz Beam, PharmD, [name] Clinical Pharmacist [facility] [phone]

## 2019-07-17 ENCOUNTER — Telehealth: Payer: Self-pay

## 2019-09-04 ENCOUNTER — Telehealth: Payer: Self-pay
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
## 2019-12-16 ENCOUNTER — Telehealth: Payer: Self-pay | Admitting: Family

## 2019-12-16 ENCOUNTER — Ambulatory Visit: Payer: Medicare HMO | Admitting: Family

## 2019-12-16 NOTE — Telephone Encounter (Signed)
Patient did not show for his Heart Failure Clinic appointment on 12/16/19. Will attempt to reschedule.

## 2020-05-25 ENCOUNTER — Telehealth: Payer: Self-pay | Admitting: Family Medicine

## 2020-05-25 NOTE — Telephone Encounter (Signed)
Attempted to contact patient to schedule appointment for CPE. Unable to reach patient and unable to leave voicemail.

## 2020-08-07 DIAGNOSIS — J441 Chronic obstructive pulmonary disease with (acute) exacerbation: Secondary | ICD-10-CM | POA: Diagnosis not present

## 2020-08-07 DIAGNOSIS — R140 Abdominal distension (gaseous): Secondary | ICD-10-CM | POA: Diagnosis not present

## 2020-08-07 DIAGNOSIS — K429 Umbilical hernia without obstruction or gangrene: Secondary | ICD-10-CM | POA: Diagnosis not present

## 2020-08-07 DIAGNOSIS — J9692 Respiratory failure, unspecified with hypercapnia: Secondary | ICD-10-CM | POA: Diagnosis not present

## 2020-08-07 DIAGNOSIS — I517 Cardiomegaly: Secondary | ICD-10-CM | POA: Diagnosis not present

## 2020-08-07 DIAGNOSIS — R0902 Hypoxemia: Secondary | ICD-10-CM | POA: Diagnosis not present

## 2020-08-07 DIAGNOSIS — J9691 Respiratory failure, unspecified with hypoxia: Secondary | ICD-10-CM | POA: Diagnosis not present

## 2020-08-07 DIAGNOSIS — J449 Chronic obstructive pulmonary disease, unspecified: Secondary | ICD-10-CM | POA: Diagnosis not present

## 2020-08-07 DIAGNOSIS — R0989 Other specified symptoms and signs involving the circulatory and respiratory systems: Secondary | ICD-10-CM | POA: Diagnosis not present

## 2020-08-07 DIAGNOSIS — R0600 Dyspnea, unspecified: Secondary | ICD-10-CM | POA: Diagnosis not present

## 2020-08-07 DIAGNOSIS — R0602 Shortness of breath: Secondary | ICD-10-CM | POA: Diagnosis not present

## 2020-08-07 DIAGNOSIS — I5022 Chronic systolic (congestive) heart failure: Secondary | ICD-10-CM | POA: Diagnosis not present

## 2020-08-07 DIAGNOSIS — R112 Nausea with vomiting, unspecified: Secondary | ICD-10-CM | POA: Diagnosis not present

## 2020-08-07 DIAGNOSIS — K4020 Bilateral inguinal hernia, without obstruction or gangrene, not specified as recurrent: Secondary | ICD-10-CM | POA: Diagnosis not present

## 2020-08-07 DIAGNOSIS — Z20822 Contact with and (suspected) exposure to covid-19: Secondary | ICD-10-CM | POA: Diagnosis not present

## 2020-08-07 DIAGNOSIS — T490X6A Underdosing of local antifungal, anti-infective and anti-inflammatory drugs, initial encounter: Secondary | ICD-10-CM | POA: Diagnosis not present

## 2020-08-07 DIAGNOSIS — T501X6A Underdosing of loop [high-ceiling] diuretics, initial encounter: Secondary | ICD-10-CM | POA: Diagnosis not present

## 2020-08-07 DIAGNOSIS — I110 Hypertensive heart disease with heart failure: Secondary | ICD-10-CM | POA: Diagnosis not present

## 2020-08-08 DIAGNOSIS — J9691 Respiratory failure, unspecified with hypoxia: Secondary | ICD-10-CM | POA: Diagnosis not present

## 2020-08-08 DIAGNOSIS — I5022 Chronic systolic (congestive) heart failure: Secondary | ICD-10-CM | POA: Diagnosis not present

## 2020-08-08 DIAGNOSIS — J449 Chronic obstructive pulmonary disease, unspecified: Secondary | ICD-10-CM | POA: Diagnosis not present

## 2020-08-08 DIAGNOSIS — J9692 Respiratory failure, unspecified with hypercapnia: Secondary | ICD-10-CM | POA: Diagnosis not present

## 2020-08-08 DIAGNOSIS — G4733 Obstructive sleep apnea (adult) (pediatric): Secondary | ICD-10-CM | POA: Diagnosis not present

## 2020-08-09 DIAGNOSIS — I5022 Chronic systolic (congestive) heart failure: Secondary | ICD-10-CM | POA: Diagnosis not present

## 2020-08-09 DIAGNOSIS — J449 Chronic obstructive pulmonary disease, unspecified: Secondary | ICD-10-CM | POA: Diagnosis not present

## 2020-08-09 DIAGNOSIS — R0902 Hypoxemia: Secondary | ICD-10-CM | POA: Diagnosis not present

## 2020-08-09 DIAGNOSIS — Z6835 Body mass index (BMI) 35.0-35.9, adult: Secondary | ICD-10-CM | POA: Diagnosis not present

## 2020-08-09 DIAGNOSIS — I2510 Atherosclerotic heart disease of native coronary artery without angina pectoris: Secondary | ICD-10-CM | POA: Diagnosis not present

## 2020-08-30 DIAGNOSIS — I700 Atherosclerosis of aorta: Secondary | ICD-10-CM | POA: Insufficient documentation

## 2020-10-28 ENCOUNTER — Ambulatory Visit (INDEPENDENT_AMBULATORY_CARE_PROVIDER_SITE_OTHER): Payer: Medicare HMO | Admitting: Family Medicine

## 2020-10-28 ENCOUNTER — Other Ambulatory Visit: Payer: Self-pay

## 2020-10-28 ENCOUNTER — Encounter: Payer: Self-pay | Admitting: Family Medicine

## 2020-10-28 VITALS — BP 124/74 | HR 99 | Temp 98.3°F | Ht 71.0 in | Wt 306.0 lb

## 2020-10-28 DIAGNOSIS — E782 Mixed hyperlipidemia: Secondary | ICD-10-CM

## 2020-10-28 DIAGNOSIS — R81 Glycosuria: Secondary | ICD-10-CM

## 2020-10-28 DIAGNOSIS — I700 Atherosclerosis of aorta: Secondary | ICD-10-CM

## 2020-10-28 DIAGNOSIS — S069X9A Unspecified intracranial injury with loss of consciousness of unspecified duration, initial encounter: Secondary | ICD-10-CM

## 2020-10-28 DIAGNOSIS — I5022 Chronic systolic (congestive) heart failure: Secondary | ICD-10-CM

## 2020-10-28 DIAGNOSIS — J449 Chronic obstructive pulmonary disease, unspecified: Secondary | ICD-10-CM | POA: Diagnosis not present

## 2020-10-28 DIAGNOSIS — I350 Nonrheumatic aortic (valve) stenosis: Secondary | ICD-10-CM | POA: Diagnosis not present

## 2020-10-28 DIAGNOSIS — I10 Essential (primary) hypertension: Secondary | ICD-10-CM | POA: Diagnosis not present

## 2020-10-28 DIAGNOSIS — K429 Umbilical hernia without obstruction or gangrene: Secondary | ICD-10-CM | POA: Diagnosis not present

## 2020-10-28 DIAGNOSIS — Z125 Encounter for screening for malignant neoplasm of prostate: Secondary | ICD-10-CM

## 2020-10-28 DIAGNOSIS — F411 Generalized anxiety disorder: Secondary | ICD-10-CM

## 2020-10-28 LAB — URINALYSIS, ROUTINE W REFLEX MICROSCOPIC
Bilirubin, UA: NEGATIVE
Leukocytes,UA: NEGATIVE
Nitrite, UA: NEGATIVE
Specific Gravity, UA: >1.03 — ABNORMAL HIGH (ref 1.005–1.030)
Urobilinogen, Ur: 0.2 mg/dL (ref 0.2–1.0)
pH, UA: 5.5 (ref 5.0–7.5)

## 2020-10-28 LAB — MICROALBUMIN, URINE WAIVED
Creatinine, Urine Waived: 300 mg/dL (ref 10–300)
Microalb, Ur Waived: 80 mg/L — ABNORMAL HIGH (ref 0–19)

## 2020-10-28 LAB — MICROSCOPIC EXAMINATION
Bacteria, UA: NONE SEEN
Epithelial Cells (non renal): NONE SEEN /hpf (ref 0–10)

## 2020-10-28 MED ORDER — QUETIAPINE FUMARATE ER 50 MG PO TB24: 50.0000 mg | ORAL_TABLET | Freq: Every day | ORAL | 3 refills | Status: DC

## 2020-10-28 MED ORDER — DULERA 200-5 MCG/ACT IN AERO: 2.0000 | INHALATION_SPRAY | Freq: Two times a day (BID) | RESPIRATORY_TRACT | 6 refills | Status: DC

## 2020-10-28 MED ORDER — ALBUTEROL SULFATE HFA 108 (90 BASE) MCG/ACT IN AERS: 2.0000 | INHALATION_SPRAY | Freq: Four times a day (QID) | RESPIRATORY_TRACT | 2 refills | Status: DC | PRN

## 2020-10-28 MED ORDER — POLYETHYLENE GLYCOL 3350 17 GM/SCOOP PO POWD: 17.0000 g | Freq: Two times a day (BID) | ORAL | 1 refills | Status: DC | PRN

## 2020-10-28 NOTE — Progress Notes (Signed)
BP 124/74   Pulse 99   Temp 98.3 F (36.8 C) (Oral)   Ht 5' 11"  (1.803 m)   Wt (!) 306 lb (138.8 kg)   SpO2 94%   BMI 42.68 kg/m    Subjective:    Patient ID: Chad Gibbs, male    DOB: Jan 24, 1966, 55 y.o.   MRN: [id number]  HPI: Chad Gibbs is a 55 y.o. male who presents today after being lost to follow up for 2 years. He has been following with cardiology during that time.   Chief Complaint  Patient presents with  . Hernia   HYPERTENSION / HYPERLIPIDEMIA Satisfied with current treatment? yes Duration of hypertension: chronic BP monitoring frequency: not checking BP medication side effects: no Past BP meds: entresto, lasix, carvedilol Duration of hyperlipidemia: chronic Cholesterol medication side effects: not on anything Cholesterol supplements: none Past cholesterol medications: none Medication compliance: good compliance Aspirin: yes Recent stressors: yes Recurrent headaches: no Visual changes: no Palpitations: no Dyspnea: no Chest pain: no Lower extremity edema: no Dizzy/lightheaded: no  HERNIA Duration: chronic Location:  belly button Painful: yes Discomfort: yes Bulge: yes Quality:  aching Onset: gradual Severity: mild Context: biger Aggravating factors: sitting  DEPRESSION Mood status: uncontrolled Satisfied with current treatment?: no Symptom severity: moderate  Duration of current treatment : not on anything Side effects: no Medication compliance: not on anything Psychotherapy/counseling: no  Previous psychiatric medications: latuda Depressed mood: yes Anxious mood: yes Anhedonia: no Significant weight loss or gain: no Insomnia: yes hard to fall asleep Fatigue: yes Feelings of worthlessness or guilt: yes Impaired concentration/indecisiveness: yes Suicidal ideations: no Hopelessness: no Crying spells: no Depression screen [facility] 2/9 04/27/2019 04/23/2019 01/06/2018 01/03/2017  Decreased Interest 1 0 0 0  Down, Depressed,  Hopeless 0 0 0 0  PHQ - 2 Score 1 0 0 0  Altered sleeping 3 - - -  Tired, decreased energy 3 - - -  Change in appetite 3 - - -  Feeling bad or failure about yourself  3 - - -  Trouble concentrating 0 - - -  Moving slowly or fidgety/restless 0 - - -  Suicidal thoughts 0 - - -  PHQ-9 Score 13 - - -    Relevant past medical, surgical, family and social history reviewed and updated as indicated. Interim medical history since our last visit reviewed. Allergies and medications reviewed and updated.  Review of Systems  Constitutional: Negative.   Respiratory: Negative.   Cardiovascular: Negative.   Gastrointestinal: Negative.   Musculoskeletal: Negative.   Skin: Negative.   Psychiatric/Behavioral: Positive for dysphoric mood and sleep disturbance. Negative for agitation, behavioral problems, confusion, decreased concentration, hallucinations and self-injury. The patient is nervous/anxious. The patient is not hyperactive.     Per HPI unless specifically indicated above     Objective:    BP 124/74   Pulse 99   Temp 98.3 F (36.8 C) (Oral)   Ht 5' 11"  (1.803 m)   Wt (!) 306 lb (138.8 kg)   SpO2 94%   BMI 42.68 kg/m   Wt Readings from Last 3 Encounters:  10/28/20 (!) 306 lb (138.8 kg)  06/17/19 261 lb 12.8 oz (118.8 kg)  04/27/19 262 lb 8 oz (119.1 kg)    Physical Exam Vitals and nursing note reviewed.  Constitutional:      General: He is not in acute distress.    Appearance: Normal appearance. He is obese. He is not ill-appearing, toxic-appearing or diaphoretic.  HENT:  Head: Normocephalic and atraumatic.     Right Ear: External ear normal.     Left Ear: External ear normal.     Nose: Nose normal.     Mouth/Throat:     Mouth: Mucous membranes are moist.     Pharynx: Oropharynx is clear.  Eyes:     General: No scleral icterus.       Right eye: No discharge.        Left eye: No discharge.     Extraocular Movements: Extraocular movements intact.      Conjunctiva/sclera: Conjunctivae normal.     Pupils: Pupils are equal, round, and reactive to light.  Cardiovascular:     Rate and Rhythm: Normal rate and regular rhythm.     Pulses: Normal pulses.     Heart sounds: Murmur heard.  No friction rub. No gallop.   Pulmonary:     Effort: Pulmonary effort is normal. No respiratory distress.     Breath sounds: No stridor. Wheezing present. No rhonchi or rales.  Chest:     Chest wall: No tenderness.  Abdominal:     General: Abdomen is flat. Bowel sounds are normal. There is no distension.     Palpations: Abdomen is soft. There is no mass.     Tenderness: There is no abdominal tenderness. There is no right CVA tenderness, left CVA tenderness, guarding or rebound.     Hernia: A hernia (large umbilical hernia) is present.  Musculoskeletal:        General: Normal range of motion.     Cervical back: Normal range of motion and neck supple.  Skin:    General: Skin is warm and dry.     Capillary Refill: Capillary refill takes less than 2 seconds.     Coloration: Skin is not jaundiced or pale.     Findings: No bruising, erythema, lesion or rash.  Neurological:     General: No focal deficit present.     Mental Status: He is alert and oriented to person, place, and time. Mental status is at baseline.  Psychiatric:        Mood and Affect: Mood normal.        Behavior: Behavior normal.        Thought Content: Thought content normal.        Judgment: Judgment normal.     Results for orders placed or performed in visit on 10/28/20  Microscopic Examination   Urine  Result Value Ref Range   WBC, UA 0-5 0 - 5 /hpf   RBC 0-2 0 - 2 /hpf   Epithelial Cells (non renal) None seen 0 - 10 /hpf   Mucus, UA Present (A) Not Estab.   Bacteria, UA None seen None seen/Few  Comprehensive metabolic panel  Result Value Ref Range   Glucose 109 (H) 65 - 99 mg/dL   BUN 13 6 - 24 mg/dL   Creatinine, Ser 0.86 0.76 - 1.27 mg/dL   eGFR 101 >59 mL/min/1.73    BUN/Creatinine Ratio 15 9 - 20   Sodium 141 134 - 144 mmol/L   Potassium 3.9 3.5 - 5.2 mmol/L   Chloride 102 96 - 106 mmol/L   CO2 23 20 - 29 mmol/L   Calcium 9.0 8.7 - 10.2 mg/dL   Total Protein 7.2 6.0 - 8.5 g/dL   Albumin 4.2 3.8 - 4.9 g/dL   Globulin, Total 3.0 1.5 - 4.5 g/dL   Albumin/Globulin Ratio 1.4 1.2 - 2.2   Bilirubin Total 0.4  0.0 - 1.2 mg/dL   Alkaline Phosphatase 95 44 - 121 IU/L   AST 11 0 - 40 IU/L   ALT 14 0 - 44 IU/L  CBC with Differential/Platelet  Result Value Ref Range   WBC 10.7 3.4 - 10.8 x10E3/uL   RBC 5.24 4.14 - 5.80 x10E6/uL   Hemoglobin 14.9 13.0 - 17.7 g/dL   Hematocrit 45.7 37.5 - 51.0 %   MCV 87 79 - 97 fL   MCH 28.4 26.6 - 33.0 pg   MCHC 32.6 31.5 - 35.7 g/dL   RDW 13.8 11.6 - 15.4 %   Platelets 299 150 - 450 x10E3/uL   Neutrophils 59 Not Estab. %   Lymphs 26 Not Estab. %   Monocytes 8 Not Estab. %   Eos 6 Not Estab. %   Basos 1 Not Estab. %   Neutrophils Absolute 6.3 1.4 - 7.0 x10E3/uL   Lymphocytes Absolute 2.8 0.7 - 3.1 x10E3/uL   Monocytes Absolute 0.9 0.1 - 0.9 x10E3/uL   EOS (ABSOLUTE) 0.6 (H) 0.0 - 0.4 x10E3/uL   Basophils Absolute 0.1 0.0 - 0.2 x10E3/uL   Immature Granulocytes 0 Not Estab. %   Immature Grans (Abs) 0.0 0.0 - 0.1 x10E3/uL  Lipid Panel w/o Chol/HDL Ratio  Result Value Ref Range   Cholesterol, Total 186 100 - 199 mg/dL   Triglycerides 153 (H) 0 - 149 mg/dL   HDL 42 >39 mg/dL   VLDL Cholesterol Cal 27 5 - 40 mg/dL   LDL Chol Calc (NIH) 117 (H) 0 - 99 mg/dL  PSA  Result Value Ref Range   Prostate Specific Ag, Serum 0.8 0.0 - 4.0 ng/mL  TSH  Result Value Ref Range   TSH 2.600 0.450 - 4.500 uIU/mL  Urinalysis, Routine w reflex microscopic  Result Value Ref Range   Specific Gravity, UA >1.030 (H) 1.005 - 1.030   pH, UA 5.5 5.0 - 7.5   Color, UA Yellow Yellow   Appearance Ur Clear Clear   Leukocytes,UA Negative Negative   Protein,UA 1+ (A) Negative/Trace   Glucose, UA 3+ (A) Negative   Ketones, UA Trace (A)  Negative   RBC, UA Trace (A) Negative   Bilirubin, UA Negative Negative   Urobilinogen, Ur 0.2 0.2 - 1.0 mg/dL   Nitrite, UA Negative Negative   Microscopic Examination See below:   Microalbumin, Urine Waived  Result Value Ref Range   Microalb, Ur Waived 80 (H) 0 - 19 mg/L   Creatinine, Urine Waived 300 10 - 300 mg/dL   Microalb/Creat Ratio 30-300 (H) <30 mg/g      Assessment & Plan:   Problem List Items Addressed This Visit      Cardiovascular and Mediastinum   Atherosclerosis of aorta ([place])    Will keep his BP and cholesterol under good control. Continue to monitor.       Relevant Orders   Comprehensive metabolic panel (Completed)   Chronic systolic CHF (congestive heart failure) ([place])    Follows with cardiology. Stable. Continue to monitor. Labs drawn today. Continue to follow with cardiology.       Relevant Orders   Comprehensive metabolic panel (Completed)   AMB Referral to Community Care Coordinaton   Non-rheumatic aortic stenosis    Follows with cardiology. Call with any concerns. Continue to monitor.       Relevant Orders   Comprehensive metabolic panel (Completed)     Respiratory   COPD (chronic obstructive pulmonary disease) ([place])    Not under good control. Will  restart his dulera and recheck 1 month. Call with any concerns.       Relevant Medications   mometasone-formoterol (DULERA) 200-5 MCG/ACT AERO   albuterol (VENTOLIN HFA) 108 (90 Base) MCG/ACT inhaler   Other Relevant Orders   Comprehensive metabolic panel (Completed)   CBC with Differential/Platelet (Completed)     Nervous and Auditory   TBI (traumatic brain injury) ([place])    Stable. Continue to monitor. Call with any concerns.       Relevant Orders   Comprehensive metabolic panel (Completed)     Other   GAD (generalized anxiety disorder)    Will start seroquel at bedtime to help with sleep. Call with any concerns. Continue to monitor.       Umbilical hernia without obstruction and  without gangrene - Primary    Previously general surgery could not fix it due to hypoxia. Levels better since starting to see cardiology. Has gotten bigger. New referral generated today.      Relevant Orders   Ambulatory referral to General Surgery   Mixed hyperlipidemia    Not on meds. Rechecking labs today. Await results. Treat as needed.      Relevant Orders   Comprehensive metabolic panel (Completed)   Lipid Panel w/o Chol/HDL Ratio (Completed)    Other Visit Diagnoses    Primary hypertension       Relevant Orders   TSH (Completed)   Urinalysis, Routine w reflex microscopic (Completed)   Microalbumin, Urine Waived (Completed)   Screening for prostate cancer       Labs drawn today. Await results.    Relevant Orders   PSA (Completed)   Glucosuria       Likely due to his farxiga- checking labs today.       Follow up plan: Return in about 4 weeks (around 11/25/2020).

## 2020-10-29 LAB — CBC WITH DIFFERENTIAL/PLATELET
Basophils Absolute: 0.1 10*3/uL (ref 0.0–0.2)
Basos: 1 %
EOS (ABSOLUTE): 0.6 10*3/uL — ABNORMAL HIGH (ref 0.0–0.4)
Eos: 6 %
Hematocrit: 45.7 % (ref 37.5–51.0)
Hemoglobin: 14.9 g/dL (ref 13.0–17.7)
Immature Grans (Abs): 0 10*3/uL (ref 0.0–0.1)
Immature Granulocytes: 0 %
Lymphocytes Absolute: 2.8 10*3/uL (ref 0.7–3.1)
Lymphs: 26 %
MCH: 28.4 pg (ref 26.6–33.0)
MCHC: 32.6 g/dL (ref 31.5–35.7)
MCV: 87 fL (ref 79–97)
Monocytes Absolute: 0.9 10*3/uL (ref 0.1–0.9)
Monocytes: 8 %
Neutrophils Absolute: 6.3 10*3/uL (ref 1.4–7.0)
Neutrophils: 59 %
Platelets: 299 10*3/uL (ref 150–450)
RBC: 5.24 x10E6/uL (ref 4.14–5.80)
RDW: 13.8 % (ref 11.6–15.4)
WBC: 10.7 10*3/uL (ref 3.4–10.8)

## 2020-10-29 LAB — LIPID PANEL W/O CHOL/HDL RATIO
Cholesterol, Total: 186 mg/dL (ref 100–199)
HDL: 42 mg/dL (ref >39)
LDL Chol Calc (NIH): 117 mg/dL — ABNORMAL HIGH (ref 0–99)
Triglycerides: 153 mg/dL — ABNORMAL HIGH (ref 0–149)
VLDL Cholesterol Cal: 27 mg/dL (ref 5–40)

## 2020-10-29 LAB — COMPREHENSIVE METABOLIC PANEL
ALT: 14 IU/L (ref 0–44)
AST: 11 IU/L (ref 0–40)
Albumin/Globulin Ratio: 1.4 (ref 1.2–2.2)
Albumin: 4.2 g/dL (ref 3.8–4.9)
Alkaline Phosphatase: 95 IU/L (ref 44–121)
BUN/Creatinine Ratio: 15 (ref 9–20)
BUN: 13 mg/dL (ref 6–24)
Bilirubin Total: 0.4 mg/dL (ref 0.0–1.2)
CO2: 23 mmol/L (ref 20–29)
Calcium: 9 mg/dL (ref 8.7–10.2)
Chloride: 102 mmol/L (ref 96–106)
Creatinine, Ser: 0.86 mg/dL (ref 0.76–1.27)
Globulin, Total: 3 g/dL (ref 1.5–4.5)
Glucose: 109 mg/dL — ABNORMAL HIGH (ref 65–99)
Potassium: 3.9 mmol/L (ref 3.5–5.2)
Sodium: 141 mmol/L (ref 134–144)
Total Protein: 7.2 g/dL (ref 6.0–8.5)
eGFR: 101 mL/min/{1.73_m2} (ref >59)

## 2020-10-29 LAB — PSA: Prostate Specific Ag, Serum: 0.8 ng/mL (ref 0.0–4.0)

## 2020-10-29 LAB — TSH: TSH: 2.6 u[IU]/mL (ref 0.450–4.500)

## 2020-10-31 ENCOUNTER — Encounter: Payer: Self-pay | Admitting: Family Medicine

## 2020-10-31 DIAGNOSIS — E782 Mixed hyperlipidemia: Secondary | ICD-10-CM | POA: Insufficient documentation

## 2020-10-31 DIAGNOSIS — I350 Nonrheumatic aortic (valve) stenosis: Secondary | ICD-10-CM | POA: Insufficient documentation

## 2020-10-31 NOTE — Assessment & Plan Note (Signed)
Previously general surgery could not fix it due to hypoxia. Levels better since starting to see cardiology. Has gotten bigger. New referral generated today.

## 2020-10-31 NOTE — Assessment & Plan Note (Signed)
Will start seroquel at bedtime to help with sleep. Call with any concerns. Continue to monitor.

## 2020-10-31 NOTE — Assessment & Plan Note (Signed)
Follows with cardiology. Stable. Continue to monitor. Labs drawn today. Continue to follow with cardiology.

## 2020-10-31 NOTE — Assessment & Plan Note (Signed)
Stable. Continue to monitor. Call with any concerns.  ?

## 2020-10-31 NOTE — Assessment & Plan Note (Signed)
Follows with cardiology. Call with any concerns. Continue to monitor. 

## 2020-10-31 NOTE — Assessment & Plan Note (Signed)
Not on meds. Rechecking labs today. Await results. Treat as needed.

## 2020-10-31 NOTE — Assessment & Plan Note (Signed)
Not under good control. Will restart his dulera and recheck 1 month. Call with any concerns.

## 2020-10-31 NOTE — Assessment & Plan Note (Signed)
Will keep his BP and cholesterol under good control. Continue to monitor.

## 2020-11-01 ENCOUNTER — Telehealth: Payer: Self-pay

## 2020-11-01 NOTE — Chronic Care Management (AMB) (Signed)
  Chronic Care Management   Note  11/01/2020 Name: FOX SALMINEN MRN: [id number] DOB: 08-16-1965  [name] is a 55 y.o. year old male who is a primary care patient of Dorcas Carrow, DO. ROYALE SWAMY is currently enrolled in care management services. An additional referral for RN CM  was placed.   Follow up plan: Telephone appointment with care management team member scheduled for:12/13/2020  Penne Lash, RMA Care Guide, Embedded Care Coordination [facility]  [place], [place] [address] Direct Dial: [phone] [email or web address]

## 2020-11-02 ENCOUNTER — Telehealth: Payer: Self-pay

## 2020-11-02 NOTE — Telephone Encounter (Signed)
PA for QUEtiapine Fumarate ER 50MG  er tablets approved. Will contact patient to make aware to call pharmacy and have prescription re-run.

## 2020-11-02 NOTE — Telephone Encounter (Signed)
PA initiated via CoverMyMeds for QUEtiapine Fumarate ER 50MG  tablets.  Waiting on response.  KEY: [id number]

## 2020-11-08 ENCOUNTER — Ambulatory Visit: Payer: Medicare HMO | Admitting: Surgery

## 2020-11-10 ENCOUNTER — Telehealth: Payer: Self-pay

## 2020-11-10 NOTE — Telephone Encounter (Signed)
PA initiated for prescription Dulera via CoverMyMeds and PA was approved

## 2020-11-17 ENCOUNTER — Encounter: Payer: Self-pay | Admitting: *Deleted

## 2020-11-25 ENCOUNTER — Other Ambulatory Visit: Payer: Self-pay

## 2020-11-25 ENCOUNTER — Encounter: Payer: Self-pay | Admitting: Family Medicine

## 2020-11-25 ENCOUNTER — Ambulatory Visit (INDEPENDENT_AMBULATORY_CARE_PROVIDER_SITE_OTHER): Payer: Medicare HMO | Admitting: Family Medicine

## 2020-11-25 VITALS — BP 103/68 | HR 83 | Wt 306.0 lb

## 2020-11-25 DIAGNOSIS — J449 Chronic obstructive pulmonary disease, unspecified: Secondary | ICD-10-CM

## 2020-11-25 MED ORDER — ALBUTEROL SULFATE (2.5 MG/3ML) 0.083% IN NEBU
2.5000 mg | INHALATION_SOLUTION | Freq: Once | RESPIRATORY_TRACT | Status: AC
Start: 2020-11-25 — End: 2020-11-25
  Administered 2020-11-25: 2.5 mg via RESPIRATORY_TRACT

## 2020-11-25 MED ORDER — TRELEGY ELLIPTA 100-62.5-25 MCG/INH IN AEPB: 1.0000 | INHALATION_SPRAY | Freq: Every day | RESPIRATORY_TRACT | 11 refills | Status: DC

## 2020-11-25 NOTE — Progress Notes (Signed)
BP 103/68   Pulse 83   Wt (!) 306 lb (138.8 kg)   SpO2 94%   BMI 42.68 kg/m    Subjective:    Patient ID: Chad Gibbs, male    DOB: 23-Apr-1966, 55 y.o.   MRN: [id number]  HPI: Chad Gibbs is a 55 y.o. male  Chief Complaint  Patient presents with   COPD   Hernia    Patient would like referral to [facility] for general surgery for hernia    COPD COPD status: better Satisfied with current treatment?: yes Oxygen use: no Dyspnea frequency: daily Cough frequency: daily Rescue inhaler frequency: daily   Limitation of activity: yes Productive cough: yes Pneumovax: Up to Date Influenza:  postpone to flu season   Relevant past medical, surgical, family and social history reviewed and updated as indicated. Interim medical history since our last visit reviewed. Allergies and medications reviewed and updated.  Review of Systems  Constitutional: Negative.   HENT: Negative.    Respiratory:  Positive for cough, shortness of breath and wheezing. Negative for apnea, choking, chest tightness and stridor.   Cardiovascular: Negative.   Gastrointestinal: Negative.   Musculoskeletal: Negative.   Psychiatric/Behavioral: Negative.     Per HPI unless specifically indicated above     Objective:    BP 103/68   Pulse 83   Wt (!) 306 lb (138.8 kg)   SpO2 94%   BMI 42.68 kg/m   Wt Readings from Last 3 Encounters:  11/25/20 (!) 306 lb (138.8 kg)  10/28/20 (!) 306 lb (138.8 kg)  06/17/19 261 lb 12.8 oz (118.8 kg)    Physical Exam Vitals and nursing note reviewed.  Constitutional:      General: He is not in acute distress.    Appearance: Normal appearance. He is not ill-appearing, toxic-appearing or diaphoretic.  HENT:     Head: Normocephalic and atraumatic.     Right Ear: External ear normal.     Left Ear: External ear normal.     Nose: Nose normal.     Mouth/Throat:     Mouth: Mucous membranes are moist.     Pharynx: Oropharynx is clear.  Eyes:     General: No scleral  icterus.       Right eye: No discharge.        Left eye: No discharge.     Extraocular Movements: Extraocular movements intact.     Conjunctiva/sclera: Conjunctivae normal.     Pupils: Pupils are equal, round, and reactive to light.  Cardiovascular:     Rate and Rhythm: Normal rate and regular rhythm.     Pulses: Normal pulses.     Heart sounds: Normal heart sounds. No murmur heard.   No friction rub. No gallop.  Pulmonary:     Effort: Pulmonary effort is normal. No respiratory distress.     Breath sounds: Normal breath sounds. No stridor. No wheezing, rhonchi or rales.  Chest:     Chest wall: No tenderness.  Musculoskeletal:        General: Normal range of motion.     Cervical back: Normal range of motion and neck supple.  Skin:    General: Skin is warm and dry.     Capillary Refill: Capillary refill takes less than 2 seconds.     Coloration: Skin is not jaundiced or pale.     Findings: No bruising, erythema, lesion or rash.  Neurological:     General: No focal deficit present.  Mental Status: He is alert and oriented to person, place, and time. Mental status is at baseline.  Psychiatric:        Mood and Affect: Mood normal.        Behavior: Behavior normal.        Thought Content: Thought content normal.        Judgment: Judgment normal.    Results for orders placed or performed in visit on 10/28/20  Microscopic Examination   Urine  Result Value Ref Range   WBC, UA 0-5 0 - 5 /hpf   RBC 0-2 0 - 2 /hpf   Epithelial Cells (non renal) None seen 0 - 10 /hpf   Mucus, UA Present (A) Not Estab.   Bacteria, UA None seen None seen/Few  Comprehensive metabolic panel  Result Value Ref Range   Glucose 109 (H) 65 - 99 mg/dL   BUN 13 6 - 24 mg/dL   Creatinine, Ser 0.86 0.76 - 1.27 mg/dL   eGFR 101 >59 mL/min/1.73   BUN/Creatinine Ratio 15 9 - 20   Sodium 141 134 - 144 mmol/L   Potassium 3.9 3.5 - 5.2 mmol/L   Chloride 102 96 - 106 mmol/L   CO2 23 20 - 29 mmol/L   Calcium  9.0 8.7 - 10.2 mg/dL   Total Protein 7.2 6.0 - 8.5 g/dL   Albumin 4.2 3.8 - 4.9 g/dL   Globulin, Total 3.0 1.5 - 4.5 g/dL   Albumin/Globulin Ratio 1.4 1.2 - 2.2   Bilirubin Total 0.4 0.0 - 1.2 mg/dL   Alkaline Phosphatase 95 44 - 121 IU/L   AST 11 0 - 40 IU/L   ALT 14 0 - 44 IU/L  CBC with Differential/Platelet  Result Value Ref Range   WBC 10.7 3.4 - 10.8 x10E3/uL   RBC 5.24 4.14 - 5.80 x10E6/uL   Hemoglobin 14.9 13.0 - 17.7 g/dL   Hematocrit 45.7 37.5 - 51.0 %   MCV 87 79 - 97 fL   MCH 28.4 26.6 - 33.0 pg   MCHC 32.6 31.5 - 35.7 g/dL   RDW 13.8 11.6 - 15.4 %   Platelets 299 150 - 450 x10E3/uL   Neutrophils 59 Not Estab. %   Lymphs 26 Not Estab. %   Monocytes 8 Not Estab. %   Eos 6 Not Estab. %   Basos 1 Not Estab. %   Neutrophils Absolute 6.3 1.4 - 7.0 x10E3/uL   Lymphocytes Absolute 2.8 0.7 - 3.1 x10E3/uL   Monocytes Absolute 0.9 0.1 - 0.9 x10E3/uL   EOS (ABSOLUTE) 0.6 (H) 0.0 - 0.4 x10E3/uL   Basophils Absolute 0.1 0.0 - 0.2 x10E3/uL   Immature Granulocytes 0 Not Estab. %   Immature Grans (Abs) 0.0 0.0 - 0.1 x10E3/uL  Lipid Panel w/o Chol/HDL Ratio  Result Value Ref Range   Cholesterol, Total 186 100 - 199 mg/dL   Triglycerides 153 (H) 0 - 149 mg/dL   HDL 42 >39 mg/dL   VLDL Cholesterol Cal 27 5 - 40 mg/dL   LDL Chol Calc (NIH) 117 (H) 0 - 99 mg/dL  PSA  Result Value Ref Range   Prostate Specific Ag, Serum 0.8 0.0 - 4.0 ng/mL  TSH  Result Value Ref Range   TSH 2.600 0.450 - 4.500 uIU/mL  Urinalysis, Routine w reflex microscopic  Result Value Ref Range   Specific Gravity, UA >1.030 (H) 1.005 - 1.030   pH, UA 5.5 5.0 - 7.5   Color, UA Yellow Yellow   Appearance Ur Clear  Clear   Leukocytes,UA Negative Negative   Protein,UA 1+ (A) Negative/Trace   Glucose, UA 3+ (A) Negative   Ketones, UA Trace (A) Negative   RBC, UA Trace (A) Negative   Bilirubin, UA Negative Negative   Urobilinogen, Ur 0.2 0.2 - 1.0 mg/dL   Nitrite, UA Negative Negative   Microscopic  Examination See below:   Microalbumin, Urine Waived  Result Value Ref Range   Microalb, Ur Waived 80 (H) 0 - 19 mg/L   Creatinine, Urine Waived 300 10 - 300 mg/dL   Microalb/Creat Ratio 30-300 (H) <30 mg/g      Assessment & Plan:   Problem List Items Addressed This Visit       Respiratory   COPD (chronic obstructive pulmonary disease) ([place]) - Primary    Still not doing great. Will change from dulera to trelegy and recheck 2 months. Call with any concerns.        Relevant Medications   albuterol (PROVENTIL) (2.5 MG/3ML) 0.083% nebulizer solution 2.5 mg   Fluticasone-Umeclidin-Vilant ([name] ELLIPTA) 100-62.5-25 MCG/INH AEPB     Follow up plan: Return in about 2 months (around 01/25/2021).

## 2020-11-25 NOTE — Assessment & Plan Note (Signed)
Still not doing great. Will change from dulera to trelegy and recheck 2 months. Call with any concerns.

## 2020-12-13 ENCOUNTER — Telehealth: Payer: Medicare HMO

## 2020-12-13 ENCOUNTER — Telehealth: Payer: Self-pay | Admitting: General Practice

## 2020-12-13 NOTE — Telephone Encounter (Signed)
  Care Management   Follow Up Note   12/13/2020 Name: MARVION BASTIDAS MRN: [id number] DOB: 01-18-1966   Referred by: Dorcas Carrow, DO Reason for referral : Chronic Care Management (RNCM: Follow up for Chronic Disease Management and Care Coordination Needs)   An unsuccessful telephone outreach was attempted today. The patient was referred to the case management team for assistance with care management and care coordination.   Follow Up Plan: A HIPPA compliant phone message was left for the patient providing contact information and requesting a return call.   Alto Denver RN, MSN, CCM Community Care Coordinator [place]  [facility] Mobile: [phone]

## 2020-12-21 ENCOUNTER — Telehealth: Payer: Self-pay

## 2020-12-21 NOTE — Chronic Care Management (AMB) (Signed)
  Care Management   Note  12/21/2020 Name: [name] MRN: [id number] DOB: [birth year]  Chad Gibbs is a 55 y.o. year old male who is a primary care patient of Dorcas Carrow, DO and is actively engaged with the care management team. I reached out to Chad Gibbs by phone today to assist with re-scheduling an initial visit with the RN Case Manager  Follow up plan: Unsuccessful telephone outreach attempt made. A HIPAA compliant phone message was left for the patient providing contact information and requesting a return call.  The care management team will reach out to the patient again over the next 7 days.  If patient returns call to provider office, please advise to call Embedded Care Management Care Guide Penne Lash  at [phone]  Penne Lash, RMA Care Guide, Embedded Care Coordination [facility]  [place], [place] [address] Direct Dial: [phone] [email or web address]

## 2020-12-29 NOTE — Chronic Care Management (AMB) (Signed)
  Care Management   Note  12/29/2020 Name: Chad Gibbs MRN: [id number] DOB: 1966/01/31  Chad Gibbs is a 55 y.o. year old male who is a primary care patient of Dorcas Carrow, DO and is actively engaged with the care management team. I reached out to Chad Gibbs by phone today to assist with re-scheduling an initial visit with the RN Case Manager  Follow up plan: Unsuccessful telephone outreach attempt made. A HIPAA compliant phone message was left for the patient providing contact information and requesting a return call.  The care management team will reach out to the patient again over the next 7 days.  If patient returns call to provider office, please advise to call Embedded Care Management Care Guide Penne Lash  at [phone]  Penne Lash, RMA Care Guide, Embedded Care Coordination [facility]  [place], [place] [address] Direct Dial: [phone] [email or web address]

## 2021-01-03 NOTE — Chronic Care Management (AMB) (Signed)
  Care Management   Note  01/03/2021 Name: [name] MRN: [id number] DOB: 10-Aug-1965  Chad Gibbs is a 55 y.o. year old male who is a primary care patient of Dorcas Carrow, DO and is actively engaged with the care management team. I reached out to Chad Gibbs by phone today to assist with re-scheduling an initial visit with the RN Case Manager  Follow up plan: Telephone appointment with care management team member scheduled for:01/11/2021  Penne Lash, RMA Care Guide, Embedded Care Coordination [facility]  [place], [place] [address] Direct Dial: [phone] [email or web address]

## 2021-01-11 ENCOUNTER — Telehealth: Payer: Medicare HMO

## 2021-01-11 ENCOUNTER — Telehealth: Payer: Self-pay | Admitting: General Practice

## 2021-01-11 NOTE — Telephone Encounter (Signed)
  Care Management   Follow Up Note   01/11/2021 Name: Chad Gibbs MRN: [id number] DOB: 06/05/1966   Referred by: Dorcas Carrow, DO Reason for referral : Chronic Care Management (RNCM; Initial Outreach call for Chronic Disease Management and Care Coordination Needs )   A second unsuccessful telephone outreach was attempted today. The patient was referred to the case management team for assistance with care management and care coordination.   Follow Up Plan: A HIPPA compliant phone message was left for the patient providing contact information and requesting a return call.   Alto Denver RN, MSN, CCM Community Care Coordinator [place]  [facility] Mobile: [phone]

## 2021-01-24 ENCOUNTER — Telehealth: Payer: Self-pay

## 2021-01-24 NOTE — Chronic Care Management (AMB) (Signed)
  Care Management   Note  01/24/2021 Name: [name] MRN: [id number] DOB: [birth year]  Chad Gibbs is a 55 y.o. year old male who is a primary care patient of Dorcas Carrow, DO and is actively engaged with the care management team. I reached out to Chad Gibbs by phone today to assist with re-scheduling an initial visit with the RN Case Manager  Follow up plan: Unsuccessful telephone outreach attempt made. A HIPAA compliant phone message was left for the patient providing contact information and requesting a return call.  The care management team will reach out to the patient again over the next 7 days.  If patient returns call to provider office, please advise to call Embedded Care Management Care Guide Penne Lash  at [phone]  Penne Lash, RMA Care Guide, Embedded Care Coordination [facility]  [place], [place] [address] Direct Dial: [phone] [email or web address]

## 2021-01-25 ENCOUNTER — Encounter: Payer: Self-pay | Admitting: Pharmacist

## 2021-01-25 DIAGNOSIS — Z5320 Procedure and treatment not carried out because of patient's decision for unspecified reasons: Secondary | ICD-10-CM

## 2021-01-25 NOTE — Progress Notes (Signed)
[facility])                                            [facility] Quality Pharmacy Team                                        Statin Quality Measure Assessment    01/25/2021  BREKEN NAZARI 1965/08/13 [id number]   Per review of chart and payor information, patient has a diagnosis of diabetes but is not currently filling a statin prescription.  This places patient into the SUPD (Statin Use In Patients with Diabetes) measure for CMS.    Patient declined statin therapy.  (Previously)-Has appointment with PCP 01/26/21-Of deemed therapeutically appropriate, statin therapy could be discussed with the patient during the appointment.  The ASCVD Risk score Denman George DC Jr., et al., 2013) failed to calculate for the following reasons:   The patient has a prior MI or stroke diagnosis 10/28/2020     Component Value Date/Time   CHOL 186 10/28/2020 1456   TRIG 153 (H) 10/28/2020 1456   HDL 42 10/28/2020 1456   LDLCALC 117 (H) 10/28/2020 1456    Please consider ONE of the following recommendations:  Initiate high intensity statin Atorvastatin 40mg  once daily, #90, 3 refills   Rosuvastatin 20mg  once daily, #90, 3 refills    Initiate moderate intensity          statin with reduced frequency if prior          statin intolerance 1x weekly, #13, 3 refills   2x weekly, #26, 3 refills   3x weekly, #39, 3 refills    Code for past statin intolerance or  other exclusions (required annually)  Provider Requirements: Associate code during an office visit or telehealth encounter  Drug Induced Myopathy G72.0   Myopathy, unspecified G72.9   Myositis, unspecified M60.9   Rhabdomyolysis M62.82   Alcoholic fatty liver K70.0   Cirrhosis of liver K74.69   Prediabetes R73.03   PCOS E28.2   Toxic liver disease, unspecified K71.9   Adverse effect of antihyperlipidemic and antiarteriosclerotic drugs, initial encounter T46.6X5A    Plan: Send note to Dr.  prior to office visit on 01/26/21.  Laural Benes, PharmD, BCACP [facility] Clinical Pharmacist [phone]

## 2021-01-26 ENCOUNTER — Ambulatory Visit (INDEPENDENT_AMBULATORY_CARE_PROVIDER_SITE_OTHER): Payer: Medicare HMO | Admitting: Family Medicine

## 2021-01-26 ENCOUNTER — Other Ambulatory Visit: Payer: Self-pay

## 2021-01-26 ENCOUNTER — Encounter: Payer: Self-pay | Admitting: Family Medicine

## 2021-01-26 VITALS — BP 115/79 | HR 76 | Wt 314.5 lb

## 2021-01-26 DIAGNOSIS — J449 Chronic obstructive pulmonary disease, unspecified: Secondary | ICD-10-CM

## 2021-01-26 DIAGNOSIS — F411 Generalized anxiety disorder: Secondary | ICD-10-CM | POA: Diagnosis not present

## 2021-01-26 DIAGNOSIS — E782 Mixed hyperlipidemia: Secondary | ICD-10-CM | POA: Diagnosis not present

## 2021-01-26 DIAGNOSIS — I5022 Chronic systolic (congestive) heart failure: Secondary | ICD-10-CM | POA: Diagnosis not present

## 2021-01-26 DIAGNOSIS — R69 Illness, unspecified: Secondary | ICD-10-CM | POA: Diagnosis not present

## 2021-01-26 MED ORDER — CARVEDILOL 12.5 MG PO TABS: 12.5000 mg | ORAL_TABLET | Freq: Two times a day (BID) | ORAL | 1 refills | Status: DC

## 2021-01-26 MED ORDER — FUROSEMIDE 40 MG PO TABS: 40.0000 mg | ORAL_TABLET | Freq: Every day | ORAL | 1 refills | Status: DC

## 2021-01-26 MED ORDER — QUETIAPINE FUMARATE ER 50 MG PO TB24: 50.0000 mg | ORAL_TABLET | Freq: Every day | ORAL | 1 refills | Status: DC

## 2021-01-26 MED ORDER — ALBUTEROL SULFATE HFA 108 (90 BASE) MCG/ACT IN AERS: 2.0000 | INHALATION_SPRAY | Freq: Four times a day (QID) | RESPIRATORY_TRACT | 3 refills | Status: DC | PRN

## 2021-01-26 NOTE — Assessment & Plan Note (Signed)
Under good control on current regimen. Continue current regimen. Continue to monitor. Call with any concerns. Refills given.   

## 2021-01-26 NOTE — Assessment & Plan Note (Signed)
Doing well with his trelegy. Feeling better. Continue current regimen. Continue to monitor. Refills given.

## 2021-01-26 NOTE — Assessment & Plan Note (Signed)
Declines statin. Will recheck labs next visit.

## 2021-01-26 NOTE — Assessment & Plan Note (Signed)
Continue to follow with cardiology. Would like help getting back to being able to work out. Will get him into cardiac rehab. Call with any concerns. Recheck 3-4 months.

## 2021-01-26 NOTE — Progress Notes (Signed)
BP 115/79   Pulse 76   Wt (!) 314 lb 8 oz (142.7 kg)   SpO2 94%   BMI 43.86 kg/m    Subjective:    Patient ID: Chad Gibbs, male    DOB: Aug 02, 1965, 55 y.o.   MRN: [id number]  HPI: Chad Gibbs is a 55 y.o. male  Chief Complaint  Patient presents with   COPD   COPD COPD status: better Satisfied with current treatment?: yes Oxygen use: no Dyspnea frequency: occasionally Cough frequency: occasionally Rescue inhaler frequency:  rarely Limitation of activity: yes Pneumovax: Up to Date Influenza: Up to Date  Has put on a bunch of weight, would like to start exercising, but is anxious about doing it safely given his CHF and his COPD.   Relevant past medical, surgical, family and social history reviewed and updated as indicated. Interim medical history since our last visit reviewed. Allergies and medications reviewed and updated.  Review of Systems  Constitutional:  Positive for fatigue and unexpected weight change. Negative for activity change, appetite change, chills, diaphoresis and fever.  HENT: Negative.    Respiratory:  Positive for cough, shortness of breath and wheezing. Negative for apnea, choking, chest tightness and stridor.   Cardiovascular: Negative.   Gastrointestinal: Negative.   Musculoskeletal: Negative.   Neurological: Negative.   Psychiatric/Behavioral: Negative.     Per HPI unless specifically indicated above     Objective:    BP 115/79   Pulse 76   Wt (!) 314 lb 8 oz (142.7 kg)   SpO2 94%   BMI 43.86 kg/m   Wt Readings from Last 3 Encounters:  01/26/21 (!) 314 lb 8 oz (142.7 kg)  11/25/20 (!) 306 lb (138.8 kg)  10/28/20 (!) 306 lb (138.8 kg)    Physical Exam Vitals and nursing note reviewed.  Constitutional:      General: He is not in acute distress.    Appearance: Normal appearance. He is not ill-appearing, toxic-appearing or diaphoretic.  HENT:     Head: Normocephalic and atraumatic.     Right Ear: External ear normal.      Left Ear: External ear normal.     Nose: Nose normal.     Mouth/Throat:     Mouth: Mucous membranes are moist.     Pharynx: Oropharynx is clear.  Eyes:     General: No scleral icterus.       Right eye: No discharge.        Left eye: No discharge.     Extraocular Movements: Extraocular movements intact.     Conjunctiva/sclera: Conjunctivae normal.     Pupils: Pupils are equal, round, and reactive to light.  Cardiovascular:     Rate and Rhythm: Normal rate and regular rhythm.     Pulses: Normal pulses.     Heart sounds: Normal heart sounds. No murmur heard.   No friction rub. No gallop.  Pulmonary:     Effort: Pulmonary effort is normal. No respiratory distress.     Breath sounds: Normal breath sounds. No stridor. No wheezing, rhonchi or rales.  Chest:     Chest wall: No tenderness.  Musculoskeletal:        General: Normal range of motion.     Cervical back: Normal range of motion and neck supple.  Skin:    General: Skin is warm and dry.     Capillary Refill: Capillary refill takes less than 2 seconds.     Coloration: Skin is not jaundiced or  pale.     Findings: No bruising, erythema, lesion or rash.  Neurological:     General: No focal deficit present.     Mental Status: He is alert and oriented to person, place, and time. Mental status is at baseline.  Psychiatric:        Mood and Affect: Mood normal.        Behavior: Behavior normal.        Thought Content: Thought content normal.        Judgment: Judgment normal.    Results for orders placed or performed in visit on 10/28/20  Microscopic Examination   Urine  Result Value Ref Range   WBC, UA 0-5 0 - 5 /hpf   RBC 0-2 0 - 2 /hpf   Epithelial Cells (non renal) None seen 0 - 10 /hpf   Mucus, UA Present (A) Not Estab.   Bacteria, UA None seen None seen/Few  Comprehensive metabolic panel  Result Value Ref Range   Glucose 109 (H) 65 - 99 mg/dL   BUN 13 6 - 24 mg/dL   Creatinine, Ser 0.86 0.76 - 1.27 mg/dL   eGFR 101  >59 mL/min/1.73   BUN/Creatinine Ratio 15 9 - 20   Sodium 141 134 - 144 mmol/L   Potassium 3.9 3.5 - 5.2 mmol/L   Chloride 102 96 - 106 mmol/L   CO2 23 20 - 29 mmol/L   Calcium 9.0 8.7 - 10.2 mg/dL   Total Protein 7.2 6.0 - 8.5 g/dL   Albumin 4.2 3.8 - 4.9 g/dL   Globulin, Total 3.0 1.5 - 4.5 g/dL   Albumin/Globulin Ratio 1.4 1.2 - 2.2   Bilirubin Total 0.4 0.0 - 1.2 mg/dL   Alkaline Phosphatase 95 44 - 121 IU/L   AST 11 0 - 40 IU/L   ALT 14 0 - 44 IU/L  CBC with Differential/Platelet  Result Value Ref Range   WBC 10.7 3.4 - 10.8 x10E3/uL   RBC 5.24 4.14 - 5.80 x10E6/uL   Hemoglobin 14.9 13.0 - 17.7 g/dL   Hematocrit 45.7 37.5 - 51.0 %   MCV 87 79 - 97 fL   MCH 28.4 26.6 - 33.0 pg   MCHC 32.6 31.5 - 35.7 g/dL   RDW 13.8 11.6 - 15.4 %   Platelets 299 150 - 450 x10E3/uL   Neutrophils 59 Not Estab. %   Lymphs 26 Not Estab. %   Monocytes 8 Not Estab. %   Eos 6 Not Estab. %   Basos 1 Not Estab. %   Neutrophils Absolute 6.3 1.4 - 7.0 x10E3/uL   Lymphocytes Absolute 2.8 0.7 - 3.1 x10E3/uL   Monocytes Absolute 0.9 0.1 - 0.9 x10E3/uL   EOS (ABSOLUTE) 0.6 (H) 0.0 - 0.4 x10E3/uL   Basophils Absolute 0.1 0.0 - 0.2 x10E3/uL   Immature Granulocytes 0 Not Estab. %   Immature Grans (Abs) 0.0 0.0 - 0.1 x10E3/uL  Lipid Panel w/o Chol/HDL Ratio  Result Value Ref Range   Cholesterol, Total 186 100 - 199 mg/dL   Triglycerides 153 (H) 0 - 149 mg/dL   HDL 42 >39 mg/dL   VLDL Cholesterol Cal 27 5 - 40 mg/dL   LDL Chol Calc (NIH) 117 (H) 0 - 99 mg/dL  PSA  Result Value Ref Range   Prostate Specific Ag, Serum 0.8 0.0 - 4.0 ng/mL  TSH  Result Value Ref Range   TSH 2.600 0.450 - 4.500 uIU/mL  Urinalysis, Routine w reflex microscopic  Result Value Ref Range  Specific Gravity, UA >1.030 (H) 1.005 - 1.030   pH, UA 5.5 5.0 - 7.5   Color, UA Yellow Yellow   Appearance Ur Clear Clear   Leukocytes,UA Negative Negative   Protein,UA 1+ (A) Negative/Trace   Glucose, UA 3+ (A) Negative    Ketones, UA Trace (A) Negative   RBC, UA Trace (A) Negative   Bilirubin, UA Negative Negative   Urobilinogen, Ur 0.2 0.2 - 1.0 mg/dL   Nitrite, UA Negative Negative   Microscopic Examination See below:   Microalbumin, Urine Waived  Result Value Ref Range   Microalb, Ur Waived 80 (H) 0 - 19 mg/L   Creatinine, Urine Waived 300 10 - 300 mg/dL   Microalb/Creat Ratio 30-300 (H) <30 mg/g      Assessment & Plan:   Problem List Items Addressed This Visit       Cardiovascular and Mediastinum   Chronic systolic CHF (congestive heart failure) ([place])    Continue to follow with cardiology. Would like help getting back to being able to work out. Will get him into cardiac rehab. Call with any concerns. Recheck 3-4 months.       Relevant Medications   furosemide (LASIX) 40 MG tablet   carvedilol (COREG) 12.5 MG tablet   Other Relevant Orders   CBC with Differential/Platelet   Comprehensive metabolic panel   Amb Referral to Cardiac Rehabilitation     Respiratory   COPD (chronic obstructive pulmonary disease) ([place]) - Primary    Doing well with his trelegy. Feeling better. Continue current regimen. Continue to monitor. Refills given.       Relevant Medications   albuterol (VENTOLIN HFA) 108 (90 Base) MCG/ACT inhaler     Other   GAD (generalized anxiety disorder)    Under good control on current regimen. Continue current regimen. Continue to monitor. Call with any concerns. Refills given.        Mixed hyperlipidemia    Declines statin. Will recheck labs next visit.       Relevant Medications   furosemide (LASIX) 40 MG tablet   carvedilol (COREG) 12.5 MG tablet   Other Relevant Orders   CBC with Differential/Platelet   Comprehensive metabolic panel     Follow up plan: Return in about 3 months (around 04/28/2021).

## 2021-01-26 NOTE — Patient Instructions (Addendum)
[facility] General Surgery [phone]  Dr. Gwen Pounds [phone]

## 2021-01-27 ENCOUNTER — Ambulatory Visit (INDEPENDENT_AMBULATORY_CARE_PROVIDER_SITE_OTHER): Payer: Medicare HMO

## 2021-01-27 VITALS — Ht 71.0 in | Wt 314.0 lb

## 2021-01-27 DIAGNOSIS — Z0000 Encounter for general adult medical examination without abnormal findings: Secondary | ICD-10-CM

## 2021-01-27 LAB — COMPREHENSIVE METABOLIC PANEL
ALT: 13 IU/L (ref 0–44)
AST: 12 IU/L (ref 0–40)
Albumin/Globulin Ratio: 1.3 (ref 1.2–2.2)
Albumin: 4.3 g/dL (ref 3.8–4.9)
Alkaline Phosphatase: 108 IU/L (ref 44–121)
BUN/Creatinine Ratio: 19 (ref 9–20)
BUN: 17 mg/dL (ref 6–24)
Bilirubin Total: 0.5 mg/dL (ref 0.0–1.2)
CO2: 26 mmol/L (ref 20–29)
Calcium: 9.1 mg/dL (ref 8.7–10.2)
Chloride: 95 mmol/L — ABNORMAL LOW (ref 96–106)
Creatinine, Ser: 0.88 mg/dL (ref 0.76–1.27)
Globulin, Total: 3.2 g/dL (ref 1.5–4.5)
Glucose: 86 mg/dL (ref 65–99)
Potassium: 4.2 mmol/L (ref 3.5–5.2)
Sodium: 137 mmol/L (ref 134–144)
Total Protein: 7.5 g/dL (ref 6.0–8.5)
eGFR: 100 mL/min/{1.73_m2} (ref >59)

## 2021-01-27 LAB — CBC WITH DIFFERENTIAL/PLATELET
Basophils Absolute: 0.1 10*3/uL (ref 0.0–0.2)
Basos: 1 %
EOS (ABSOLUTE): 0.5 10*3/uL — ABNORMAL HIGH (ref 0.0–0.4)
Eos: 5 %
Hematocrit: 46.5 % (ref 37.5–51.0)
Hemoglobin: 15.1 g/dL (ref 13.0–17.7)
Immature Grans (Abs): 0 10*3/uL (ref 0.0–0.1)
Immature Granulocytes: 0 %
Lymphocytes Absolute: 2.5 10*3/uL (ref 0.7–3.1)
Lymphs: 25 %
MCH: 28.4 pg (ref 26.6–33.0)
MCHC: 32.5 g/dL (ref 31.5–35.7)
MCV: 88 fL (ref 79–97)
Monocytes Absolute: 0.8 10*3/uL (ref 0.1–0.9)
Monocytes: 8 %
Neutrophils Absolute: 6 10*3/uL (ref 1.4–7.0)
Neutrophils: 61 %
Platelets: 267 10*3/uL (ref 150–450)
RBC: 5.31 x10E6/uL (ref 4.14–5.80)
RDW: 14.4 % (ref 11.6–15.4)
WBC: 9.8 10*3/uL (ref 3.4–10.8)
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
## 2021-01-27 NOTE — Patient Instructions (Signed)
Chad Gibbs , Thank you for taking time to come for your Medicare Wellness Visit. I appreciate your ongoing commitment to your health goals. Please review the following plan we discussed and let me know if I can assist you in the future.   Screening recommendations/referrals: Colonoscopy: states had a cologuard in the past couple of years Recommended yearly ophthalmology/optometry visit for glaucoma screening and checkup Recommended yearly dental visit for hygiene and checkup  Vaccinations: Influenza vaccine: decline Pneumococcal vaccine: completed 01/07/2017 Tdap vaccine: completed 12/18/2016, due 12/19/2026 Shingles vaccine: discussed   Covid-19: decline  Advanced directives: Please bring a copy of your POA (Power of Attorney) and/or Living Will to your next appointment.   Conditions/risks identified: smoking  Next appointment: Follow up in one year for your annual wellness visit   Preventive Care 40-64 Years, Male Preventive care refers to lifestyle choices and visits with your health care provider that can promote health and wellness. What does preventive care include? A yearly physical exam. This is also called an annual well check. Dental exams once or twice a year. Routine eye exams. Ask your health care provider how often you should have your eyes checked. Personal lifestyle choices, including: Daily care of your teeth and gums. Regular physical activity. Eating a healthy diet. Avoiding tobacco and drug use. Limiting alcohol use. Practicing safe sex. Taking low-dose aspirin every day starting at age 23. What happens during an annual well check? The services and screenings done by your health care provider during your annual well check will depend on your age, overall health, lifestyle risk factors, and family history of disease. Counseling  Your health care provider may ask you questions about your: Alcohol use. Tobacco use. Drug use. Emotional well-being. Home and  relationship well-being. Sexual activity. Eating habits. Work and work Astronomer. Screening  You may have the following tests or measurements: Height, weight, and BMI. Blood pressure. Lipid and cholesterol levels. These may be checked every 5 years, or more frequently if you are over 48 years old. Skin check. Lung cancer screening. You may have this screening every year starting at age 2 if you have a 30-pack-year history of smoking and currently smoke or have quit within the past 15 years. Fecal occult blood test (FOBT) of the stool. You may have this test every year starting at age 67. Flexible sigmoidoscopy or colonoscopy. You may have a sigmoidoscopy every 5 years or a colonoscopy every 10 years starting at age 52. Prostate cancer screening. Recommendations will vary depending on your family history and other risks. Hepatitis C blood test. Hepatitis B blood test. Sexually transmitted disease (STD) testing. Diabetes screening. This is done by checking your blood sugar (glucose) after you have not eaten for a while (fasting). You may have this done every 1-3 years. Discuss your test results, treatment options, and if necessary, the need for more tests with your health care provider. Vaccines  Your health care provider may recommend certain vaccines, such as: Influenza vaccine. This is recommended every year. Tetanus, diphtheria, and acellular pertussis (Tdap, Td) vaccine. You may need a Td booster every 10 years. Zoster vaccine. You may need this after age 68. Pneumococcal 13-valent conjugate (PCV13) vaccine. You may need this if you have certain conditions and have not been vaccinated. Pneumococcal polysaccharide (PPSV23) vaccine. You may need one or two doses if you smoke cigarettes or if you have certain conditions. Talk to your health care provider about which screenings and vaccines you need and how often you need them.  This information is not intended to replace advice given to  you by your health care provider. Make sure you discuss any questions you have with your health care provider. Document Released: 06/24/2015 Document Revised: 02/15/2016 Document Reviewed: 03/29/2015 Elsevier Interactive Patient Education  2017 [employer].  Fall Prevention in the Home Falls can cause injuries. They can happen to people of all ages. There are many things you can do to make your home safe and to help prevent falls. What can I do on the outside of my home? Regularly fix the edges of walkways and driveways and fix any cracks. Remove anything that might make you trip as you walk through a door, such as a raised step or threshold. Trim any bushes or trees on the path to your home. Use bright outdoor lighting. Clear any walking paths of anything that might make someone trip, such as rocks or tools. Regularly check to see if handrails are loose or broken. Make sure that both sides of any steps have handrails. Any raised decks and porches should have guardrails on the edges. Have any leaves, snow, or ice cleared regularly. Use sand or salt on walking paths during winter. Clean up any spills in your garage right away. This includes oil or grease spills. What can I do in the bathroom? Use night lights. Install grab bars by the toilet and in the tub and shower. Do not use towel bars as grab bars. Use non-skid mats or decals in the tub or shower. If you need to sit down in the shower, use a plastic, non-slip stool. Keep the floor dry. Clean up any water that spills on the floor as soon as it happens. Remove soap buildup in the tub or shower regularly. Attach bath mats securely with double-sided non-slip rug tape. Do not have throw rugs and other things on the floor that can make you trip. What can I do in the bedroom? Use night lights. Make sure that you have a light by your bed that is easy to reach. Do not use any sheets or blankets that are too big for your bed. They should not  hang down onto the floor. Have a firm chair that has side arms. You can use this for support while you get dressed. Do not have throw rugs and other things on the floor that can make you trip. What can I do in the kitchen? Clean up any spills right away. Avoid walking on wet floors. Keep items that you use a lot in easy-to-reach places. If you need to reach something above you, use a strong step stool that has a grab bar. Keep electrical cords out of the way. Do not use floor polish or wax that makes floors slippery. If you must use wax, use non-skid floor wax. Do not have throw rugs and other things on the floor that can make you trip. What can I do with my stairs? Do not leave any items on the stairs. Make sure that there are handrails on both sides of the stairs and use them. Fix handrails that are broken or loose. Make sure that handrails are as long as the stairways. Check any carpeting to make sure that it is firmly attached to the stairs. Fix any carpet that is loose or worn. Avoid having throw rugs at the top or bottom of the stairs. If you do have throw rugs, attach them to the floor with carpet tape. Make sure that you have a light switch at the  top of the stairs and the bottom of the stairs. If you do not have them, ask someone to add them for you. What else can I do to help prevent falls? Wear shoes that: Do not have high heels. Have rubber bottoms. Are comfortable and fit you well. Are closed at the toe. Do not wear sandals. If you use a stepladder: Make sure that it is fully opened. Do not climb a closed stepladder. Make sure that both sides of the stepladder are locked into place. Ask someone to hold it for you, if possible. Clearly mark and make sure that you can see: Any grab bars or handrails. First and last steps. Where the edge of each step is. Use tools that help you move around (mobility aids) if they are needed. These  include: Canes. Walkers. Scooters. Crutches. Turn on the lights when you go into a dark area. Replace any light bulbs as soon as they burn out. Set up your furniture so you have a clear path. Avoid moving your furniture around. If any of your floors are uneven, fix them. If there are any pets around you, be aware of where they are. Review your medicines with your doctor. Some medicines can make you feel dizzy. This can increase your chance of falling. Ask your doctor what other things that you can do to help prevent falls. This information is not intended to replace advice given to you by your health care provider. Make sure you discuss any questions you have with your health care provider. Document Released: 03/24/2009 Document Revised: 11/03/2015 Document Reviewed: 07/02/2014 Elsevier Interactive Patient Education  2017 [employer].

## 2021-02-07 NOTE — Chronic Care Management (AMB) (Signed)
  Care Management   Note  02/07/2021 Name: [name] MRN: [id number] DOB: Apr 24, 1966  Chad Gibbs is a 55 y.o. year old male who is a primary care patient of Dorcas Carrow, DO and is actively engaged with the care management team. I reached out to Chad Gibbs by phone today to assist with re-scheduling an initial visit with the RN Case Manager  Follow up plan: Telephone appointment with care management team member scheduled for:03/14/2021  Penne Lash, RMA Care Guide, Embedded Care Coordination [facility]  [place], [place] [address] Direct Dial: [phone] [email or web address]

## 2021-02-10 ENCOUNTER — Encounter: Payer: Medicare HMO | Attending: Family Medicine

## 2021-02-21 DIAGNOSIS — I2119 ST elevation (STEMI) myocardial infarction involving other coronary artery of inferior wall: Secondary | ICD-10-CM | POA: Diagnosis not present

## 2021-02-21 DIAGNOSIS — I2510 Atherosclerotic heart disease of native coronary artery without angina pectoris: Secondary | ICD-10-CM | POA: Diagnosis not present

## 2021-02-21 DIAGNOSIS — R9431 Abnormal electrocardiogram [ECG] [EKG]: Secondary | ICD-10-CM | POA: Diagnosis not present

## 2021-02-21 DIAGNOSIS — I350 Nonrheumatic aortic (valve) stenosis: Secondary | ICD-10-CM | POA: Diagnosis not present

## 2021-02-21 DIAGNOSIS — I5022 Chronic systolic (congestive) heart failure: Secondary | ICD-10-CM | POA: Diagnosis not present

## 2021-02-21 DIAGNOSIS — E782 Mixed hyperlipidemia: Secondary | ICD-10-CM | POA: Diagnosis not present

## 2021-02-27 ENCOUNTER — Ambulatory Visit: Payer: Medicare HMO

## 2021-03-14 ENCOUNTER — Telehealth: Payer: Medicare HMO

## 2021-03-14 ENCOUNTER — Telehealth: Payer: Self-pay

## 2021-03-14 NOTE — Telephone Encounter (Cosign Needed)
  Care Management   Follow Up Note   03/14/2021 Name: ERMINIO NYGARD MRN: [id number] DOB: 06-22-1965   Referred by: Dorcas Carrow, DO Reason for referral : Chronic Care Management (RNCM; 3rd attempt at Initial Outreach for Chronic Disease Management and Care Coordination Needs)   Third unsuccessful telephone outreach was attempted today. The patient was referred to the case management team for assistance with care management and care coordination. The patient's primary care provider has been notified of our unsuccessful attempts to make or maintain contact with the patient. The care management team is pleased to engage with this patient at any time in the future should he/she be interested in assistance from the care management team.   Follow Up Plan: We have been unable to make contact with the patient for follow up. The care management team is available to follow up with the patient after provider conversation with the patient regarding recommendation for care management engagement and subsequent re-referral to the care management team.   Alto Denver RN, MSN, CCM Community Care Coordinator [place]  [facility] Mobile: [phone]

## 2021-04-14 ENCOUNTER — Other Ambulatory Visit: Payer: Self-pay | Admitting: Family Medicine

## 2021-05-03 DIAGNOSIS — G4733 Obstructive sleep apnea (adult) (pediatric): Secondary | ICD-10-CM | POA: Diagnosis not present

## 2021-05-03 DIAGNOSIS — R0602 Shortness of breath: Secondary | ICD-10-CM | POA: Diagnosis not present

## 2021-05-03 DIAGNOSIS — I350 Nonrheumatic aortic (valve) stenosis: Secondary | ICD-10-CM | POA: Diagnosis not present

## 2021-05-03 DIAGNOSIS — Z720 Tobacco use: Secondary | ICD-10-CM | POA: Diagnosis not present

## 2021-05-03 DIAGNOSIS — I5022 Chronic systolic (congestive) heart failure: Secondary | ICD-10-CM | POA: Diagnosis not present

## 2021-05-03 DIAGNOSIS — I2510 Atherosclerotic heart disease of native coronary artery without angina pectoris: Secondary | ICD-10-CM | POA: Diagnosis not present

## 2021-05-05 ENCOUNTER — Other Ambulatory Visit: Payer: Self-pay | Admitting: Student

## 2021-05-05 DIAGNOSIS — I2510 Atherosclerotic heart disease of native coronary artery without angina pectoris: Secondary | ICD-10-CM

## 2021-05-05 DIAGNOSIS — I350 Nonrheumatic aortic (valve) stenosis: Secondary | ICD-10-CM

## 2021-05-05 DIAGNOSIS — R0602 Shortness of breath: Secondary | ICD-10-CM

## 2021-05-06 ENCOUNTER — Other Ambulatory Visit: Payer: Self-pay | Admitting: Family Medicine

## 2021-05-07 NOTE — Telephone Encounter (Signed)
Requested medication (s) are due for refill today: yes  Requested medication (s) are on the active medication list: med has expired  Last refill:  01/26/21 #180 1 RF  Future visit scheduled: yes  Notes to clinic:  Prescription expired 04/26/21   Requested Prescriptions  Pending Prescriptions Disp Refills   carvedilol (COREG) 12.5 MG tablet [Pharmacy Med Name: carvedilol 12.5 mg tablet] 180 tablet 1    Sig: TAKE ONE TABLET BY MOUTH TWICE DAILY     Cardiovascular:  Beta Blockers Passed - 05/06/2021 11:05 AM      Passed - Last BP in normal range    BP Readings from Last 1 Encounters:  01/26/21 115/79          Passed - Last Heart Rate in normal range    Pulse Readings from Last 1 Encounters:  01/26/21 76          Passed - Valid encounter within last 6 months    Recent Outpatient Visits           3 months ago Chronic obstructive pulmonary disease, unspecified COPD type (HCC)   [facility] Johnson, Megan P, DO   5 months ago Chronic obstructive pulmonary disease, unspecified COPD type (HCC)   [facility] Johnson, Megan P, DO   6 months ago Umbilical hernia without obstruction and without gangrene   [facility] Berlin, Megan P, DO   2 years ago Acute congestive heart failure, unspecified heart failure type [facility])   [facility] Woodson, Megan P, DO   2 years ago Acute congestive heart failure, unspecified heart failure type [facility])   [facility] Corazin, Megan P, DO       Future Appointments             In 4 days Laural Benes, Oralia Rud, DO [facility], [facility]   In 8 months  [facility], [facility]

## 2021-05-08 ENCOUNTER — Other Ambulatory Visit: Payer: Self-pay | Admitting: Student

## 2021-05-08 DIAGNOSIS — I5022 Chronic systolic (congestive) heart failure: Secondary | ICD-10-CM

## 2021-05-08 DIAGNOSIS — I350 Nonrheumatic aortic (valve) stenosis: Secondary | ICD-10-CM

## 2021-05-08 DIAGNOSIS — I2510 Atherosclerotic heart disease of native coronary artery without angina pectoris: Secondary | ICD-10-CM

## 2021-05-08 DIAGNOSIS — R0602 Shortness of breath: Secondary | ICD-10-CM

## 2021-05-11 ENCOUNTER — Other Ambulatory Visit: Payer: Self-pay

## 2021-05-11 ENCOUNTER — Ambulatory Visit (INDEPENDENT_AMBULATORY_CARE_PROVIDER_SITE_OTHER): Payer: Medicare HMO | Admitting: Family Medicine

## 2021-05-11 ENCOUNTER — Encounter: Payer: Self-pay | Admitting: Family Medicine

## 2021-05-11 VITALS — BP 110/71 | HR 76 | Temp 98.0°F | Wt 313.0 lb

## 2021-05-11 DIAGNOSIS — H6123 Impacted cerumen, bilateral: Secondary | ICD-10-CM

## 2021-05-11 DIAGNOSIS — R3911 Hesitancy of micturition: Secondary | ICD-10-CM | POA: Diagnosis not present

## 2021-05-11 DIAGNOSIS — Z1211 Encounter for screening for malignant neoplasm of colon: Secondary | ICD-10-CM | POA: Diagnosis not present

## 2021-05-11 DIAGNOSIS — F411 Generalized anxiety disorder: Secondary | ICD-10-CM | POA: Diagnosis not present

## 2021-05-11 DIAGNOSIS — R81 Glycosuria: Secondary | ICD-10-CM | POA: Diagnosis not present

## 2021-05-11 DIAGNOSIS — K429 Umbilical hernia without obstruction or gangrene: Secondary | ICD-10-CM

## 2021-05-11 DIAGNOSIS — J449 Chronic obstructive pulmonary disease, unspecified: Secondary | ICD-10-CM

## 2021-05-11 DIAGNOSIS — I350 Nonrheumatic aortic (valve) stenosis: Secondary | ICD-10-CM

## 2021-05-11 DIAGNOSIS — I5022 Chronic systolic (congestive) heart failure: Secondary | ICD-10-CM | POA: Diagnosis not present

## 2021-05-11 DIAGNOSIS — I10 Essential (primary) hypertension: Secondary | ICD-10-CM | POA: Diagnosis not present

## 2021-05-11 DIAGNOSIS — E782 Mixed hyperlipidemia: Secondary | ICD-10-CM

## 2021-05-11 DIAGNOSIS — I700 Atherosclerosis of aorta: Secondary | ICD-10-CM

## 2021-05-11 DIAGNOSIS — Z0000 Encounter for general adult medical examination without abnormal findings: Secondary | ICD-10-CM | POA: Diagnosis not present

## 2021-05-11 DIAGNOSIS — R7303 Prediabetes: Secondary | ICD-10-CM | POA: Diagnosis not present

## 2021-05-11 LAB — URINALYSIS, ROUTINE W REFLEX MICROSCOPIC
Bilirubin, UA: NEGATIVE
Glucose, UA: NEGATIVE
Ketones, UA: NEGATIVE
Leukocytes,UA: NEGATIVE
Nitrite, UA: NEGATIVE
Protein,UA: NEGATIVE
RBC, UA: NEGATIVE
Specific Gravity, UA: <1.005 — ABNORMAL LOW (ref 1.005–1.030)
Urobilinogen, Ur: 0.2 mg/dL (ref 0.2–1.0)
pH, UA: 5.5 (ref 5.0–7.5)

## 2021-05-11 LAB — MICROALBUMIN, URINE WAIVED
Creatinine, Urine Waived: 10 mg/dL (ref 10–300)
Microalb, Ur Waived: 10 mg/L (ref 0–19)
Microalb/Creat Ratio: <30 mg/g (ref <30)

## 2021-05-11 LAB — BAYER DCA HB A1C WAIVED: HB A1C (BAYER DCA - WAIVED): 5.8 % — ABNORMAL HIGH (ref 4.8–5.6)

## 2021-05-11 MED ORDER — DEBROX 6.5 % OT SOLN: 5.0000 [drp] | Freq: Two times a day (BID) | OTIC | 0 refills | Status: DC

## 2021-05-11 NOTE — Assessment & Plan Note (Signed)
Will keep BP and cholesterol under good control. Checking labs today. Await results. Treat as needed.

## 2021-05-11 NOTE — Assessment & Plan Note (Signed)
Continue to follow with cardiology. Call with any concerns. Continue to monitor.  
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
## 2021-05-11 NOTE — Assessment & Plan Note (Signed)
Rechecking labs today. Await results. Treat as needed.  °

## 2021-05-11 NOTE — Assessment & Plan Note (Signed)
Under good control on current regimen. Continue current regimen. Continue to monitor. Call with any concerns. Refills given. Labs drawn today.   

## 2021-05-11 NOTE — Assessment & Plan Note (Signed)
Under good control on current regimen. Continue current regimen. Continue to monitor. Call with any concerns. Refills given.   

## 2021-05-11 NOTE — Assessment & Plan Note (Signed)
Waiting on cardiology approval to have surgery.

## 2021-05-12 LAB — COMPREHENSIVE METABOLIC PANEL
ALT: 13 IU/L (ref 0–44)
AST: 14 IU/L (ref 0–40)
Albumin/Globulin Ratio: 1.4 (ref 1.2–2.2)
Albumin: 4.2 g/dL (ref 3.8–4.9)
Alkaline Phosphatase: 91 IU/L (ref 44–121)
BUN/Creatinine Ratio: 16 (ref 9–20)
BUN: 12 mg/dL (ref 6–24)
Bilirubin Total: 0.5 mg/dL (ref 0.0–1.2)
CO2: 26 mmol/L (ref 20–29)
Calcium: 8.9 mg/dL (ref 8.7–10.2)
Chloride: 97 mmol/L (ref 96–106)
Creatinine, Ser: 0.77 mg/dL (ref 0.76–1.27)
Globulin, Total: 2.9 g/dL (ref 1.5–4.5)
Glucose: 89 mg/dL (ref 70–99)
Potassium: 4.5 mmol/L (ref 3.5–5.2)
Sodium: 138 mmol/L (ref 134–144)
Total Protein: 7.1 g/dL (ref 6.0–8.5)
eGFR: 104 mL/min/{1.73_m2} (ref >59)

## 2021-05-12 LAB — LIPID PANEL W/O CHOL/HDL RATIO
Cholesterol, Total: 188 mg/dL (ref 100–199)
HDL: 37 mg/dL — ABNORMAL LOW (ref >39)
LDL Chol Calc (NIH): 130 mg/dL — ABNORMAL HIGH (ref 0–99)
Triglycerides: 115 mg/dL (ref 0–149)
VLDL Cholesterol Cal: 21 mg/dL (ref 5–40)

## 2021-05-12 LAB — CBC WITH DIFFERENTIAL/PLATELET
Basophils Absolute: 0.1 10*3/uL (ref 0.0–0.2)
Basos: 1 %
EOS (ABSOLUTE): 0.4 10*3/uL (ref 0.0–0.4)
Eos: 4 %
Hematocrit: 46.3 % (ref 37.5–51.0)
Hemoglobin: 15 g/dL (ref 13.0–17.7)
Immature Grans (Abs): 0 10*3/uL (ref 0.0–0.1)
Immature Granulocytes: 0 %
Lymphocytes Absolute: 2.1 10*3/uL (ref 0.7–3.1)
Lymphs: 20 %
MCH: 28.2 pg (ref 26.6–33.0)
MCHC: 32.4 g/dL (ref 31.5–35.7)
MCV: 87 fL (ref 79–97)
Monocytes Absolute: 0.8 10*3/uL (ref 0.1–0.9)
Monocytes: 7 %
Neutrophils Absolute: 7.3 10*3/uL — ABNORMAL HIGH (ref 1.4–7.0)
Neutrophils: 68 %
Platelets: 370 10*3/uL (ref 150–450)
RBC: 5.32 x10E6/uL (ref 4.14–5.80)
RDW: 14.7 % (ref 11.6–15.4)
WBC: 10.7 10*3/uL (ref 3.4–10.8)

## 2021-05-12 LAB — PSA: Prostate Specific Ag, Serum: 0.9 ng/mL (ref 0.0–4.0)

## 2021-05-12 LAB — TSH: TSH: 4.62 u[IU]/mL — ABNORMAL HIGH (ref 0.450–4.500)

## 2021-05-16 DIAGNOSIS — I255 Ischemic cardiomyopathy: Secondary | ICD-10-CM | POA: Diagnosis not present

## 2021-05-16 DIAGNOSIS — R9431 Abnormal electrocardiogram [ECG] [EKG]: Secondary | ICD-10-CM | POA: Diagnosis not present

## 2021-05-16 DIAGNOSIS — R69 Illness, unspecified: Secondary | ICD-10-CM | POA: Diagnosis not present

## 2021-05-16 DIAGNOSIS — J440 Chronic obstructive pulmonary disease with acute lower respiratory infection: Secondary | ICD-10-CM | POA: Diagnosis not present

## 2021-05-16 DIAGNOSIS — Z20822 Contact with and (suspected) exposure to covid-19: Secondary | ICD-10-CM | POA: Diagnosis not present

## 2021-05-16 DIAGNOSIS — I499 Cardiac arrhythmia, unspecified: Secondary | ICD-10-CM | POA: Diagnosis not present

## 2021-05-16 DIAGNOSIS — J101 Influenza due to other identified influenza virus with other respiratory manifestations: Secondary | ICD-10-CM | POA: Diagnosis not present

## 2021-05-16 DIAGNOSIS — I350 Nonrheumatic aortic (valve) stenosis: Secondary | ICD-10-CM | POA: Diagnosis not present

## 2021-05-16 DIAGNOSIS — R809 Proteinuria, unspecified: Secondary | ICD-10-CM | POA: Diagnosis not present

## 2021-05-16 DIAGNOSIS — R0600 Dyspnea, unspecified: Secondary | ICD-10-CM | POA: Diagnosis not present

## 2021-05-16 DIAGNOSIS — I509 Heart failure, unspecified: Secondary | ICD-10-CM | POA: Diagnosis not present

## 2021-05-16 DIAGNOSIS — J810 Acute pulmonary edema: Secondary | ICD-10-CM | POA: Diagnosis not present

## 2021-05-16 DIAGNOSIS — E785 Hyperlipidemia, unspecified: Secondary | ICD-10-CM | POA: Diagnosis not present

## 2021-05-16 DIAGNOSIS — R0602 Shortness of breath: Secondary | ICD-10-CM | POA: Diagnosis not present

## 2021-05-16 DIAGNOSIS — Z6841 Body Mass Index (BMI) 40.0 and over, adult: Secondary | ICD-10-CM | POA: Diagnosis not present

## 2021-05-16 DIAGNOSIS — Z9989 Dependence on other enabling machines and devices: Secondary | ICD-10-CM | POA: Diagnosis not present

## 2021-05-16 DIAGNOSIS — Z79899 Other long term (current) drug therapy: Secondary | ICD-10-CM | POA: Diagnosis not present

## 2021-05-16 DIAGNOSIS — R0902 Hypoxemia: Secondary | ICD-10-CM | POA: Diagnosis not present

## 2021-05-16 DIAGNOSIS — I5020 Unspecified systolic (congestive) heart failure: Secondary | ICD-10-CM | POA: Diagnosis not present

## 2021-05-16 DIAGNOSIS — K439 Ventral hernia without obstruction or gangrene: Secondary | ICD-10-CM | POA: Diagnosis not present

## 2021-05-16 DIAGNOSIS — F17210 Nicotine dependence, cigarettes, uncomplicated: Secondary | ICD-10-CM | POA: Diagnosis not present

## 2021-05-16 DIAGNOSIS — Z7982 Long term (current) use of aspirin: Secondary | ICD-10-CM | POA: Diagnosis not present

## 2021-05-16 DIAGNOSIS — E782 Mixed hyperlipidemia: Secondary | ICD-10-CM | POA: Diagnosis not present

## 2021-05-16 DIAGNOSIS — I2510 Atherosclerotic heart disease of native coronary artery without angina pectoris: Secondary | ICD-10-CM | POA: Diagnosis not present

## 2021-05-16 DIAGNOSIS — I5023 Acute on chronic systolic (congestive) heart failure: Secondary | ICD-10-CM | POA: Diagnosis not present

## 2021-05-16 DIAGNOSIS — D649 Anemia, unspecified: Secondary | ICD-10-CM | POA: Diagnosis not present

## 2021-05-16 DIAGNOSIS — I447 Left bundle-branch block, unspecified: Secondary | ICD-10-CM | POA: Diagnosis not present

## 2021-05-16 DIAGNOSIS — A419 Sepsis, unspecified organism: Secondary | ICD-10-CM | POA: Diagnosis not present

## 2021-05-16 DIAGNOSIS — K429 Umbilical hernia without obstruction or gangrene: Secondary | ICD-10-CM | POA: Diagnosis not present

## 2021-05-16 DIAGNOSIS — I110 Hypertensive heart disease with heart failure: Secondary | ICD-10-CM | POA: Diagnosis not present

## 2021-05-16 DIAGNOSIS — J9601 Acute respiratory failure with hypoxia: Secondary | ICD-10-CM | POA: Diagnosis not present

## 2021-05-16 DIAGNOSIS — G4733 Obstructive sleep apnea (adult) (pediatric): Secondary | ICD-10-CM | POA: Diagnosis not present

## 2021-05-17 ENCOUNTER — Ambulatory Visit: Payer: Medicare HMO

## 2021-05-17 ENCOUNTER — Ambulatory Visit: Admission: RE | Admit: 2021-05-17 | Payer: Medicare HMO | Source: Ambulatory Visit

## 2021-05-18 ENCOUNTER — Ambulatory Visit: Payer: Medicare HMO

## 2021-05-19 ENCOUNTER — Encounter: Payer: Self-pay | Admitting: Family Medicine

## 2021-05-22 DIAGNOSIS — I255 Ischemic cardiomyopathy: Secondary | ICD-10-CM | POA: Diagnosis not present

## 2021-05-22 DIAGNOSIS — E785 Hyperlipidemia, unspecified: Secondary | ICD-10-CM | POA: Diagnosis not present

## 2021-05-22 DIAGNOSIS — J101 Influenza due to other identified influenza virus with other respiratory manifestations: Secondary | ICD-10-CM | POA: Diagnosis not present

## 2021-05-22 DIAGNOSIS — J810 Acute pulmonary edema: Secondary | ICD-10-CM | POA: Diagnosis not present

## 2021-05-22 DIAGNOSIS — I350 Nonrheumatic aortic (valve) stenosis: Secondary | ICD-10-CM | POA: Diagnosis not present

## 2021-05-22 DIAGNOSIS — I2510 Atherosclerotic heart disease of native coronary artery without angina pectoris: Secondary | ICD-10-CM | POA: Diagnosis not present

## 2021-05-22 DIAGNOSIS — D649 Anemia, unspecified: Secondary | ICD-10-CM | POA: Diagnosis not present

## 2021-05-22 DIAGNOSIS — I5023 Acute on chronic systolic (congestive) heart failure: Secondary | ICD-10-CM | POA: Diagnosis not present

## 2021-05-22 DIAGNOSIS — K439 Ventral hernia without obstruction or gangrene: Secondary | ICD-10-CM | POA: Diagnosis not present

## 2021-05-22 DIAGNOSIS — J9601 Acute respiratory failure with hypoxia: Secondary | ICD-10-CM | POA: Diagnosis not present

## 2021-05-22 DIAGNOSIS — R0902 Hypoxemia: Secondary | ICD-10-CM | POA: Diagnosis not present

## 2021-05-26 ENCOUNTER — Ambulatory Visit: Payer: Medicare HMO | Admitting: Family Medicine

## 2021-05-31 ENCOUNTER — Ambulatory Visit
Admission: RE | Admit: 2021-05-31 | Discharge: 2021-05-31 | Disposition: A | Discharge status: Home / Self Care | Payer: Medicare HMO | Source: Ambulatory Visit | Attending: Student | Admitting: Student

## 2021-05-31 DIAGNOSIS — I5022 Chronic systolic (congestive) heart failure: Secondary | ICD-10-CM | POA: Insufficient documentation

## 2021-05-31 DIAGNOSIS — I2510 Atherosclerotic heart disease of native coronary artery without angina pectoris: Secondary | ICD-10-CM | POA: Diagnosis not present

## 2021-05-31 DIAGNOSIS — I350 Nonrheumatic aortic (valve) stenosis: Secondary | ICD-10-CM

## 2021-05-31 DIAGNOSIS — R0602 Shortness of breath: Secondary | ICD-10-CM | POA: Diagnosis not present

## 2021-05-31 LAB — ECHOCARDIOGRAM COMPLETE
AR max vel: 0.74 cm2
AV Area VTI: 0.86 cm2
AV Area mean vel: 0.77 cm2
AV Mean grad: 20 mmHg
AV Peak grad: 35.5 mmHg
Ao pk vel: 2.98 m/s
Area-P 1/2: 3.76 cm2
MV VTI: 2.05 cm2
S' Lateral: 3.4 cm

## 2021-05-31 MED ORDER — TECHNETIUM TC 99M TETROFOSMIN IV KIT
30.0000 | PACK | Freq: Once | INTRAVENOUS | Status: AC | PRN
  Administered 2021-05-31: 09:00:00 30.9 via INTRAVENOUS

## 2021-05-31 MED ORDER — PERFLUTREN LIPID MICROSPHERE
1.0000 mL | INTRAVENOUS | Status: AC | PRN
  Administered 2021-05-31: 10:00:00 3 mL via INTRAVENOUS
  Filled 2021-05-31: qty 10

## 2021-05-31 NOTE — Progress Notes (Signed)
*  PRELIMINARY RESULTS* Echocardiogram 2D Echocardiogram has been performed.  Chad Gibbs Chad Gibbs 05/31/2021, 10:25 AM

## 2021-06-01 ENCOUNTER — Ambulatory Visit
Admission: RE | Admit: 2021-06-01 | Discharge: 2021-06-01 | Disposition: A | Discharge status: Home / Self Care | Payer: Medicare HMO | Source: Ambulatory Visit | Attending: Student | Admitting: Student

## 2021-06-01 ENCOUNTER — Other Ambulatory Visit: Payer: Self-pay

## 2021-06-01 DIAGNOSIS — R0602 Shortness of breath: Secondary | ICD-10-CM | POA: Diagnosis not present

## 2021-06-01 DIAGNOSIS — I2510 Atherosclerotic heart disease of native coronary artery without angina pectoris: Secondary | ICD-10-CM | POA: Diagnosis not present

## 2021-06-01 DIAGNOSIS — I350 Nonrheumatic aortic (valve) stenosis: Secondary | ICD-10-CM | POA: Insufficient documentation

## 2021-06-01 MED ORDER — TECHNETIUM TC 99M TETROFOSMIN IV KIT
30.0000 | PACK | Freq: Once | INTRAVENOUS | Status: AC | PRN
  Administered 2021-06-01: 09:00:00 30.1 via INTRAVENOUS

## 2021-06-01 MED ORDER — REGADENOSON 0.4 MG/5ML IV SOLN
0.4000 mg | Freq: Once | INTRAVENOUS | Status: AC
  Administered 2021-05-31: 09:00:00 0.4 mg via INTRAVENOUS

## 2021-06-13 DIAGNOSIS — I700 Atherosclerosis of aorta: Secondary | ICD-10-CM | POA: Diagnosis not present

## 2021-06-13 DIAGNOSIS — G4733 Obstructive sleep apnea (adult) (pediatric): Secondary | ICD-10-CM | POA: Diagnosis not present

## 2021-06-13 DIAGNOSIS — I350 Nonrheumatic aortic (valve) stenosis: Secondary | ICD-10-CM | POA: Diagnosis not present

## 2021-06-13 DIAGNOSIS — E782 Mixed hyperlipidemia: Secondary | ICD-10-CM | POA: Diagnosis not present

## 2021-06-13 DIAGNOSIS — Z720 Tobacco use: Secondary | ICD-10-CM | POA: Diagnosis not present

## 2021-06-13 DIAGNOSIS — I5022 Chronic systolic (congestive) heart failure: Secondary | ICD-10-CM | POA: Diagnosis not present

## 2021-06-13 DIAGNOSIS — I2510 Atherosclerotic heart disease of native coronary artery without angina pectoris: Secondary | ICD-10-CM | POA: Diagnosis not present

## 2021-06-13 LAB — NM MYOCAR MULTI W/SPECT W/WALL MOTION / EF
Base ST Depression (mm): 0 mm
Estimated workload: 1
Exercise duration (sec): 50 s
LV dias vol: 173 mL (ref 62–150)
LV sys vol: 77 mL
MPHR: 163 {beats}/min
Nuc Stress EF: 55 %
Peak HR: 98 {beats}/min
Percent HR: 60 %
Rest HR: 84 {beats}/min
Rest Nuclear Isotope Dose: 30.9 mCi
SDS: 1
SRS: 2
SSS: 0
ST Depression (mm): 0 mm
Stress Nuclear Isotope Dose: 30.1 mCi
TID: 0.99

## 2021-06-22 DIAGNOSIS — R0902 Hypoxemia: Secondary | ICD-10-CM | POA: Diagnosis not present

## 2021-06-28 ENCOUNTER — Telehealth: Payer: Self-pay | Admitting: Pharmacist

## 2021-06-28 DIAGNOSIS — Z79899 Other long term (current) drug therapy: Secondary | ICD-10-CM

## 2021-06-28 DIAGNOSIS — Z5181 Encounter for therapeutic drug level monitoring: Secondary | ICD-10-CM

## 2021-06-28 NOTE — Progress Notes (Addendum)
[facility])                                            [facility] Quality Pharmacy Team                                        Statin Quality Measure Assessment    06/28/2021  YANDELL MCJUNKINS [birth year] [id number]    Patient carries diagnosis of cardiovascular disease s/p recent event in September 2022. Patient was prescribed high intensity statin therapy with Atorvastatin 80 mg 1 tablet PO daily. #30 issued at discharge from [facility].  NOTED: Patient did fill the initial prescription.   Call today to ensure patient adherence, screen for any intolerances, and assess need for refill. Unsuccessful attempt x1. Left voice message asking patient to return my phone call.      Component Value Date/Time   CHOL 188 05/11/2021 1134   TRIG 115 05/11/2021 1134   HDL 37 (L) 05/11/2021 1134   LDLCALC 130 (H) 05/11/2021 1134

## 2021-07-07 NOTE — Progress Notes (Signed)
Second outreach attempt to Chad Gibbs on mobile number was successful. Patient confirmed that he had Atorvastatin 80 mg on hand, patient read his bottle. Discussed cardiovascular benefit of statin protection with patient. Verified with patient that he was issued refills by [facility] office to [facility] Atorvastatin 80 mg #90 ds with 3 rfs.

## 2021-07-23 DIAGNOSIS — R0902 Hypoxemia: Secondary | ICD-10-CM | POA: Diagnosis not present

## 2021-08-09 ENCOUNTER — Ambulatory Visit (INDEPENDENT_AMBULATORY_CARE_PROVIDER_SITE_OTHER): Payer: Medicare HMO | Admitting: Family Medicine

## 2021-08-09 ENCOUNTER — Encounter: Payer: Self-pay | Admitting: Family Medicine

## 2021-08-09 ENCOUNTER — Other Ambulatory Visit: Payer: Self-pay

## 2021-08-09 VITALS — Ht 70.98 in | Wt 315.0 lb

## 2021-08-09 DIAGNOSIS — I5022 Chronic systolic (congestive) heart failure: Secondary | ICD-10-CM

## 2021-08-09 DIAGNOSIS — K429 Umbilical hernia without obstruction or gangrene: Secondary | ICD-10-CM

## 2021-08-09 DIAGNOSIS — R7989 Other specified abnormal findings of blood chemistry: Secondary | ICD-10-CM

## 2021-08-09 DIAGNOSIS — D649 Anemia, unspecified: Secondary | ICD-10-CM | POA: Diagnosis not present

## 2021-08-09 DIAGNOSIS — E782 Mixed hyperlipidemia: Secondary | ICD-10-CM | POA: Diagnosis not present

## 2021-08-09 MED ORDER — QUETIAPINE FUMARATE ER 50 MG PO TB24: 50.0000 mg | ORAL_TABLET | Freq: Every day | ORAL | 1 refills | Status: DC

## 2021-08-09 MED ORDER — FUROSEMIDE 40 MG PO TABS: 40.0000 mg | ORAL_TABLET | Freq: Every day | ORAL | 1 refills | Status: DC

## 2021-08-09 MED ORDER — TRELEGY ELLIPTA 100-62.5-25 MCG/ACT IN AEPB: 1.0000 | INHALATION_SPRAY | Freq: Every day | RESPIRATORY_TRACT | 11 refills | Status: DC

## 2021-08-09 MED ORDER — ALBUTEROL SULFATE HFA 108 (90 BASE) MCG/ACT IN AERS: 2.0000 | INHALATION_SPRAY | Freq: Four times a day (QID) | RESPIRATORY_TRACT | 3 refills | Status: DC | PRN

## 2021-08-09 NOTE — Patient Instructions (Signed)
[facility]  ?[address]  ?[place] [address] ?08/15/21 2:30PM ?

## 2021-08-09 NOTE — Progress Notes (Signed)
? ?Ht 5' 10.98" (1.803 m)   Wt (!) 315 lb (142.9 kg)   BMI 43.95 kg/m?   ? ?Subjective:  ? ? Patient ID: Chad Gibbs, male    DOB: November 18, 1965, 56 y.o.   MRN: [id number] ? ?HPI: ?Chad Gibbs is a 56 y.o. male ? ?Chief Complaint  ?Patient presents with  ? Congestive Heart Failure  ? COPD  ? ?Was hospitalized in December for a CHF exacerbation and Flu A. He has been doing well since then. During that hospitalization, there was a concern for OSA while he was in the hospital. Had cath in the hospital. Cardiac catheterization showed RCA disease of about 60-70% stenosis and tandem lesions. He saw cardiology at the beginning on Januay. Had had a ECHO and NM stress test. Cleared for hernia surgery. ? ?ANEMIA ?Anemia status:  unsure ?Etiology of anemia: blood loss  ?Duration of anemia treatment: chronic ?Compliance with treatment: good compliance ?Iron supplementation side effects: no ?Severity of anemia: mild ?Fatigue: yes ?Decreased exercise tolerance: yes  ?Dyspnea on exertion: yes ?Palpitations: yes ?Bleeding: no ?Pica: no ? ?HERNIA ?Duration: chronic ?Location:  belly button ?Painful: no ?Discomfort: yes ?Bulge: yes ?Quality:  aching and sore ?Onset: gradual ?Severity: moderate ?Context: bigger ?Aggravating factors: valsalva, lifting, coughing, and sneezing ? ?HYPERTENSION / HYPERLIPIDEMIA ?Satisfied with current treatment? yes ?Duration of hypertension: chronic ?BP monitoring frequency: not checking ?BP medication side effects: no ?Past BP meds: lasix, entresto, carvedilol ?Duration of hyperlipidemia: chronic ?Cholesterol medication side effects: not on anything ?Cholesterol supplements: none ?Past cholesterol medications: none ?Medication compliance: good compliance ?Aspirin: yes ?Recent stressors: no ?Recurrent headaches: no ?Visual changes: no ?Palpitations: no ?Dyspnea: no ?Chest pain: no ?Lower extremity edema: no ?Dizzy/lightheaded: no ? ? ?Relevant past medical, surgical, family and social history  reviewed and updated as indicated. Interim medical history since our last visit reviewed. ?Allergies and medications reviewed and updated. ? ?Review of Systems  ?Constitutional: Negative.   ?Respiratory: Negative.    ?Cardiovascular: Negative.   ?Gastrointestinal: Negative.   ?Musculoskeletal: Negative.   ?Neurological: Negative.   ?Psychiatric/Behavioral: Negative.    ? ?Per HPI unless specifically indicated above ? ?   ?Objective:  ?  ?Ht 5' 10.98" (1.803 m)   Wt (!) 315 lb (142.9 kg)   BMI 43.95 kg/m?   ?Wt Readings from Last 3 Encounters:  ?08/09/21 (!) 315 lb (142.9 kg)  ?05/11/21 (!) 313 lb (142 kg)  ?01/27/21 (!) 314 lb (142.4 kg)  ?  ?Physical Exam ?Vitals and nursing note reviewed.  ?Constitutional:   ?   General: He is not in acute distress. ?   Appearance: Normal appearance. He is obese. He is not ill-appearing, toxic-appearing or diaphoretic.  ?HENT:  ?   Head: Normocephalic and atraumatic.  ?   Right Ear: External ear normal.  ?   Left Ear: External ear normal.  ?   Nose: Nose normal.  ?   Mouth/Throat:  ?   Mouth: Mucous membranes are moist.  ?   Pharynx: Oropharynx is clear.  ?Eyes:  ?   General: No scleral icterus.    ?   Right eye: No discharge.     ?   Left eye: No discharge.  ?   Extraocular Movements: Extraocular movements intact.  ?   Conjunctiva/sclera: Conjunctivae normal.  ?   Pupils: Pupils are equal, round, and reactive to light.  ?Cardiovascular:  ?   Rate and Rhythm: Normal rate and regular rhythm.  ?   Pulses: Normal pulses.  ?  Heart sounds: Normal heart sounds. No murmur heard. ?  No friction rub. No gallop.  ?Pulmonary:  ?   Effort: Pulmonary effort is normal. No respiratory distress.  ?   Breath sounds: Normal breath sounds. No stridor. No wheezing, rhonchi or rales.  ?Chest:  ?   Chest wall: No tenderness.  ?Abdominal:  ?   General: Bowel sounds are normal. There is no distension.  ?   Palpations: There is no mass.  ?   Tenderness: There is no abdominal tenderness. There is no  right CVA tenderness, left CVA tenderness, guarding or rebound.  ?   Hernia: A hernia is present.  ?Musculoskeletal:     ?   General: Normal range of motion.  ?   Cervical back: Normal range of motion and neck supple.  ?Skin: ?   General: Skin is warm and dry.  ?   Capillary Refill: Capillary refill takes less than 2 seconds.  ?   Coloration: Skin is not jaundiced or pale.  ?   Findings: No bruising, erythema, lesion or rash.  ?Neurological:  ?   General: No focal deficit present.  ?   Mental Status: He is alert and oriented to person, place, and time. Mental status is at baseline.  ?Psychiatric:     ?   Mood and Affect: Mood normal.     ?   Behavior: Behavior normal.     ?   Thought Content: Thought content normal.     ?   Judgment: Judgment normal.  ? ? ?Results for orders placed or performed in visit on 08/09/21  ?TSH  ?Result Value Ref Range  ? TSH 5.270 (H) 0.450 - 4.500 uIU/mL  ?Comprehensive metabolic panel  ?Result Value Ref Range  ? Glucose 91 70 - 99 mg/dL  ? BUN 16 6 - 24 mg/dL  ? Creatinine, Ser 0.85 0.76 - 1.27 mg/dL  ? eGFR 101 >59 mL/min/1.73  ? BUN/Creatinine Ratio 19 9 - 20  ? Sodium 127 (L) 134 - 144 mmol/L  ? Potassium 4.3 3.5 - 5.2 mmol/L  ? Chloride 98 96 - 106 mmol/L  ? CO2 26 20 - 29 mmol/L  ? Calcium 9.3 8.7 - 10.2 mg/dL  ? Total Protein 7.3 6.0 - 8.5 g/dL  ? Albumin 4.2 3.8 - 4.9 g/dL  ? Globulin, Total 3.1 1.5 - 4.5 g/dL  ? Albumin/Globulin Ratio 1.4 1.2 - 2.2  ? Bilirubin Total 0.9 0.0 - 1.2 mg/dL  ? Alkaline Phosphatase 120 44 - 121 IU/L  ? AST 13 0 - 40 IU/L  ? ALT 12 0 - 44 IU/L  ?CBC with Differential/Platelet  ?Result Value Ref Range  ? WBC 11.3 (H) 3.4 - 10.8 x10E3/uL  ? RBC 5.14 4.14 - 5.80 x10E6/uL  ? Hemoglobin 14.8 13.0 - 17.7 g/dL  ? Hematocrit 45.1 37.5 - 51.0 %  ? MCV 88 79 - 97 fL  ? MCH 28.8 26.6 - 33.0 pg  ? MCHC 32.8 31.5 - 35.7 g/dL  ? RDW 14.6 11.6 - 15.4 %  ? Platelets 302 150 - 450 x10E3/uL  ? Neutrophils 67 Not Estab. %  ? Lymphs 20 Not Estab. %  ? Monocytes 8 Not  Estab. %  ? Eos 4 Not Estab. %  ? Basos 1 Not Estab. %  ? Neutrophils Absolute 7.6 (H) 1.4 - 7.0 x10E3/uL  ? Lymphocytes Absolute 2.2 0.7 - 3.1 x10E3/uL  ? Monocytes Absolute 0.9 0.1 - 0.9 x10E3/uL  ? EOS (ABSOLUTE) 0.5 (H)  0.0 - 0.4 x10E3/uL  ? Basophils Absolute 0.1 0.0 - 0.2 x10E3/uL  ? Immature Granulocytes 0 Not Estab. %  ? Immature Grans (Abs) 0.0 0.0 - 0.1 x10E3/uL  ?Lipid Panel w/o Chol/HDL Ratio  ?Result Value Ref Range  ? Cholesterol, Total 172 100 - 199 mg/dL  ? Triglycerides 104 0 - 149 mg/dL  ? HDL 42 >39 mg/dL  ? VLDL Cholesterol Cal 19 5 - 40 mg/dL  ? LDL Chol Calc (NIH) 111 (H) 0 - 99 mg/dL  ? ?   ?Assessment & Plan:  ? ?Problem List Items Addressed This Visit   ? ?  ? Cardiovascular and Mediastinum  ? Chronic systolic CHF (congestive heart failure) ([place]) - Primary  ?  Euvolemic today. Has been cleared by cardiology for surgery. Call with any concerns. Continue current regimen.  ?  ?  ? Relevant Medications  ? furosemide (LASIX) 40 MG tablet  ? Other Relevant Orders  ? Comprehensive metabolic panel (Completed)  ?  ? Other  ? Umbilical hernia without obstruction and without gangrene  ?  Will get him back into general surgery. Call with any concerns.  ?  ?  ? Relevant Orders  ? Ambulatory referral to General Surgery  ? Mixed hyperlipidemia  ?  Under good control on current regimen. Continue current regimen. Continue to monitor. Call with any concerns. Refills given. Labs drawn today. ? ?  ?  ? Relevant Medications  ? furosemide (LASIX) 40 MG tablet  ? Other Relevant Orders  ? Lipid Panel w/o Chol/HDL Ratio (Completed)  ? ?Other Visit Diagnoses   ? ? Normocytic anemia      ? Will recheck labs today. Await results. Treat as needed.   ? Relevant Orders  ? Comprehensive metabolic panel (Completed)  ? CBC with Differential/Platelet (Completed)  ? Abnormal thyroid blood test      ? Will recheck labs today. Await results. Treat as needed.   ? Relevant Orders  ? TSH (Completed)  ? Comprehensive metabolic panel  (Completed)  ? ?  ?  ? ?Follow up plan: ?Return in about 3 months (around 11/09/2021). ? ? ? ? ? ?

## 2021-08-10 LAB — COMPREHENSIVE METABOLIC PANEL
ALT: 12 IU/L (ref 0–44)
AST: 13 IU/L (ref 0–40)
Albumin/Globulin Ratio: 1.4 (ref 1.2–2.2)
Albumin: 4.2 g/dL (ref 3.8–4.9)
Alkaline Phosphatase: 120 IU/L (ref 44–121)
BUN/Creatinine Ratio: 19 (ref 9–20)
BUN: 16 mg/dL (ref 6–24)
Bilirubin Total: 0.9 mg/dL (ref 0.0–1.2)
CO2: 26 mmol/L (ref 20–29)
Calcium: 9.3 mg/dL (ref 8.7–10.2)
Chloride: 98 mmol/L (ref 96–106)
Creatinine, Ser: 0.85 mg/dL (ref 0.76–1.27)
Globulin, Total: 3.1 g/dL (ref 1.5–4.5)
Glucose: 91 mg/dL (ref 70–99)
Potassium: 4.3 mmol/L (ref 3.5–5.2)
Sodium: 127 mmol/L — ABNORMAL LOW (ref 134–144)
Total Protein: 7.3 g/dL (ref 6.0–8.5)
eGFR: 101 mL/min/{1.73_m2} (ref >59)

## 2021-08-10 LAB — CBC WITH DIFFERENTIAL/PLATELET
Basophils Absolute: 0.1 10*3/uL (ref 0.0–0.2)
Basos: 1 %
EOS (ABSOLUTE): 0.5 10*3/uL — ABNORMAL HIGH (ref 0.0–0.4)
Eos: 4 %
Hematocrit: 45.1 % (ref 37.5–51.0)
Hemoglobin: 14.8 g/dL (ref 13.0–17.7)
Immature Grans (Abs): 0 10*3/uL (ref 0.0–0.1)
Immature Granulocytes: 0 %
Lymphocytes Absolute: 2.2 10*3/uL (ref 0.7–3.1)
Lymphs: 20 %
MCH: 28.8 pg (ref 26.6–33.0)
MCHC: 32.8 g/dL (ref 31.5–35.7)
MCV: 88 fL (ref 79–97)
Monocytes Absolute: 0.9 10*3/uL (ref 0.1–0.9)
Monocytes: 8 %
Neutrophils Absolute: 7.6 10*3/uL — ABNORMAL HIGH (ref 1.4–7.0)
Neutrophils: 67 %
Platelets: 302 10*3/uL (ref 150–450)
RBC: 5.14 x10E6/uL (ref 4.14–5.80)
RDW: 14.6 % (ref 11.6–15.4)
WBC: 11.3 10*3/uL — ABNORMAL HIGH (ref 3.4–10.8)

## 2021-08-10 LAB — LIPID PANEL W/O CHOL/HDL RATIO
Cholesterol, Total: 172 mg/dL (ref 100–199)
HDL: 42 mg/dL (ref >39)
LDL Chol Calc (NIH): 111 mg/dL — ABNORMAL HIGH (ref 0–99)
Triglycerides: 104 mg/dL (ref 0–149)
VLDL Cholesterol Cal: 19 mg/dL (ref 5–40)

## 2021-08-10 LAB — TSH: TSH: 5.27 u[IU]/mL — ABNORMAL HIGH (ref 0.450–4.500)

## 2021-08-11 ENCOUNTER — Encounter: Payer: Self-pay | Admitting: Family Medicine

## 2021-08-14 ENCOUNTER — Telehealth: Payer: Self-pay | Admitting: Family Medicine

## 2021-08-14 ENCOUNTER — Other Ambulatory Visit: Payer: Self-pay | Admitting: Family Medicine

## 2021-08-14 ENCOUNTER — Encounter: Payer: Self-pay | Admitting: Family Medicine

## 2021-08-14 NOTE — Assessment & Plan Note (Signed)
Euvolemic today. Has been cleared by cardiology for surgery. Call with any concerns. Continue current regimen.  ?

## 2021-08-14 NOTE — Telephone Encounter (Signed)
Copied from CRM [id number]. Topic: Quick Communication - Rx Refill/Question ?>> Aug 14, 2021  3:58 PM Gaetana Michaelis A wrote: ?Medication: FARXIGA 10 MG TABS tablet [[id number]]  ? ?ENTRESTO 24-26 MG [[id number]]  ? ?furosemide (LASIX) 40 MG tablet [[id number]]  ? ?ASPIRIN LOW DOSE 81 MG EC tablet [[id number]]  ? ?QUEtiapine (SEROQUEL XR) 50 MG TB24 24 hr tablet [[id number]]  ? ?Fluticasone-Umeclidin-Vilant ([name] ELLIPTA) 100-62.5-25 MCG/ACT AEPB [[id number]]  ? ?mometasone-formoterol (DULERA) 200-5 MCG/ACT AERO [[id number]]  ? ? ?Has the patient contacted their pharmacy? Yes.   ?(Agent: If no, request that the patient contact the pharmacy for the refill. If patient does not wish to contact the pharmacy document the reason why and proceed with request.) ?(Agent: If yes, when and what did the pharmacy advise?) ? ?Preferred Pharmacy (with phone number or street name): [name] [place] [place] - [address] ?[address] [place] [address] ?Phone: [phone] Fax: [phone] ?Hours: Not open 24 hours ? ? ?Has the patient been seen for an appointment in the last year OR does the patient have an upcoming appointment? Yes.   ? ?Agent: Please be advised that RX refills may take up to 3 business days. We ask that you follow-up with your pharmacy. ?

## 2021-08-14 NOTE — Assessment & Plan Note (Signed)
Will get him back into general surgery. Call with any concerns.  ?

## 2021-08-14 NOTE — Telephone Encounter (Signed)
Patient called and advised Chad Gibbs was discontinued by provider in June, 2022 and replaced with [name]. Patient verbalized understanding. He is now using [name]. ?

## 2021-08-14 NOTE — Telephone Encounter (Signed)
Patient asked about the surgical referral to [facility]. I advised he has an appointment tomorrow at [facility]. He says he doesn't want to go there and he will need a referral to [facility]. Advised to call and cancel the appointment, number provided, will route to provider. ?

## 2021-08-14 NOTE — Assessment & Plan Note (Signed)
Under good control on current regimen. Continue current regimen. Continue to monitor. Call with any concerns. Refills given. Labs drawn today.   

## 2021-08-15 ENCOUNTER — Ambulatory Visit: Payer: Medicare HMO | Admitting: Surgery

## 2021-08-15 MED ORDER — FUROSEMIDE 40 MG PO TABS: 40.0000 mg | ORAL_TABLET | Freq: Every day | ORAL | 0 refills | Status: DC

## 2021-08-15 MED ORDER — TRELEGY ELLIPTA 100-62.5-25 MCG/ACT IN AEPB: 1.0000 | INHALATION_SPRAY | Freq: Every day | RESPIRATORY_TRACT | 3 refills | Status: DC

## 2021-08-15 MED ORDER — QUETIAPINE FUMARATE ER 50 MG PO TB24: 50.0000 mg | ORAL_TABLET | Freq: Every day | ORAL | 0 refills | Status: DC

## 2021-08-15 NOTE — Telephone Encounter (Signed)
Requested medication (s) are due for refill today: Yes  Requested medication (s) are on the active medication list: Yes  Last refill:  Medications have expired. Ruthe Mannan was D/C 11/25/20  Future visit scheduled: Yes  Notes to clinic:  Expired medications.    Requested Prescriptions  Pending Prescriptions Disp Refills   ENTRESTO 24-26 MG 60 tablet     Sig: Take 1 tablet by mouth 2 (two) times daily.     Off-Protocol Failed - 08/14/2021  5:38 PM      Failed - Medication not assigned to a protocol, review manually.      Passed - Valid encounter within last 12 months    Recent Outpatient Visits           6 days ago Chronic systolic CHF (congestive heart failure) ([place])   [place], Megan P, DO   3 months ago Routine general medical examination at a health care facility   [facility], [place] P, DO   6 months ago Chronic obstructive pulmonary disease, unspecified COPD type [facility])   [place], Megan P, DO   8 months ago Chronic obstructive pulmonary disease, unspecified COPD type ([place])   [place], Megan P, DO   9 months ago Umbilical hernia without obstruction and without gangrene   [place], [place], DO       Future Appointments             In 3 months [place], Megan P, DO [place], [facility]   In 5 months  [place], [facility]             FARXIGA 10 MG TABS tablet 30 tablet     Sig: Take 1 tablet (10 mg total) by mouth daily.     Endocrinology:  Diabetes - SGLT2 Inhibitors Passed - 08/14/2021  5:38 PM      Passed - Cr in normal range and within 360 days    Creat  Date Value Ref Range Status  03/05/2011 0.77  Final   Creatinine, Ser  Date Value Ref Range Status  08/09/2021 0.85 0.76 - 1.27 mg/dL Final          Passed - HBA1C is between 0 and 7.9 and within 180 days    HB A1C (BAYER DCA - WAIVED)  Date Value Ref Range Status   05/11/2021 5.8 (H) 4.8 - 5.6 % Final    Comment:             Prediabetes: 5.7 - 6.4          Diabetes: >6.4          Glycemic control for adults with diabetes: <7.0           Passed - eGFR in normal range and within 360 days    GFR calc Af Amer  Date Value Ref Range Status  12/31/2018 >60 >60 mL/min Final   GFR calc non Af Amer  Date Value Ref Range Status  12/31/2018 >60 >60 mL/min Final   eGFR  Date Value Ref Range Status  08/09/2021 101 >59 mL/min/1.73 Final          Passed - Valid encounter within last 6 months    Recent Outpatient Visits           6 days ago Chronic systolic CHF (congestive heart failure) ([place])   [place], Megan P, DO   3 months ago Routine general medical  examination at a health care facility   [facility], [place], DO   6 months ago Chronic obstructive pulmonary disease, unspecified COPD type [facility])   [place], Megan P, DO   8 months ago Chronic obstructive pulmonary disease, unspecified COPD type ([place])   [place], Megan P, DO   9 months ago Umbilical hernia without obstruction and without gangrene   [facility] [place], [place], DO       Future Appointments             In 3 months Johnson, Megan P, DO [place], [facility]   In 5 months  [facility], [facility]             ASPIRIN LOW DOSE 81 MG EC tablet 30 tablet     Sig: Take 1 tablet (81 mg total) by mouth daily.     Analgesics:  NSAIDS - aspirin Passed - 08/14/2021  5:38 PM      Passed - Cr in normal range and within 360 days    Creat  Date Value Ref Range Status  03/05/2011 0.77  Final   Creatinine, Ser  Date Value Ref Range Status  08/09/2021 0.85 0.76 - 1.27 mg/dL Final          Passed - eGFR is 10 or above and within 360 days    GFR calc Af Amer  Date Value Ref Range Status  12/31/2018 >60 >60 mL/min Final   GFR calc non Af Amer  Date Value  Ref Range Status  12/31/2018 >60 >60 mL/min Final   eGFR  Date Value Ref Range Status  08/09/2021 101 >59 mL/min/1.73 Final          Passed - Patient is not pregnant      Passed - Valid encounter within last 12 months    Recent Outpatient Visits           6 days ago Chronic systolic CHF (congestive heart failure) ([place])   [place], Megan P, DO   3 months ago Routine general medical examination at a health care facility   [facility], [place], DO   6 months ago Chronic obstructive pulmonary disease, unspecified COPD type ([place])   [place], Megan P, DO   8 months ago Chronic obstructive pulmonary disease, unspecified COPD type ([place])   [place], Megan P, DO   9 months ago Umbilical hernia without obstruction and without gangrene   [place], [place], DO       Future Appointments             In 3 months [place], Megan P, DO [place], [facility]   In 5 months  [facility], [facility]             mometasone-formoterol (DULERA) 200-5 MCG/ACT AERO 13 g 6    Sig: Inhale 2 puffs into the lungs 2 (two) times daily.     Pulmonology:  Combination Products Passed - 08/14/2021  5:38 PM      Passed - Valid encounter within last 12 months    Recent Outpatient Visits           6 days ago Chronic systolic CHF (congestive heart failure) ([place])   [place], Megan P, DO   3 months ago Routine general medical examination at a health care facility   [facility]  [place], Megan P, DO   6 months ago Chronic obstructive pulmonary disease, unspecified COPD type ([place])   [place], Megan P, DO   8 months ago Chronic obstructive pulmonary disease, unspecified COPD type ([place])   [place], Megan P, DO   9 months ago Umbilical hernia without obstruction and without gangrene   [place], [place], DO       Future Appointments             In 3 months [place], Megan P, DO [place], [facility]   In 5 months  [facility], [facility]            Signed Prescriptions Disp Refills   Fluticasone-Umeclidin-Vilant ([name] ELLIPTA) 100-62.5-25 MCG/ACT AEPB 1 each 3    Sig: Inhale 1 puff into the lungs daily.     There is no refill protocol information for this order     furosemide (LASIX) 40 MG tablet 90 tablet 0    Sig: Take 1 tablet (40 mg total) by mouth daily.     Cardiovascular:  Diuretics - Loop Failed - 08/14/2021  5:38 PM      Failed - Na in normal range and within 180 days    Sodium  Date Value Ref Range Status  08/09/2021 127 (L) 134 - 144 mmol/L Final          Failed - Mg Level in normal range and within 180 days    Magnesium  Date Value Ref Range Status  12/31/2018 2.1 1.7 - 2.4 mg/dL Final    Comment:    Performed at [facility], [address] [place], [place] [address]          Passed - K in normal range and within 180 days    Potassium  Date Value Ref Range Status  08/09/2021 4.3 3.5 - 5.2 mmol/L Final  03/05/2011 4.7 mmol/L Final          Passed - Ca in normal range and within 180 days    Calcium  Date Value Ref Range Status  08/09/2021 9.3 8.7 - 10.2 mg/dL Final  03/05/2011 9.3 mg/dL Final          Passed - Cr in normal range and within 180 days    Creat  Date Value Ref Range Status  03/05/2011 0.77  Final   Creatinine, Ser  Date Value Ref Range Status  08/09/2021 0.85 0.76 - 1.27 mg/dL Final          Passed - Cl in normal range and within 180 days    Chloride  Date Value Ref Range Status  08/09/2021 98 96 - 106 mmol/L Final  03/05/2011 100 mmol/L Final          Passed - Last BP in normal range    BP Readings from Last 1 Encounters:  05/11/21 110/71          Passed - Valid encounter within last 6 months    Recent Outpatient Visits           6 days ago Chronic  systolic CHF (congestive heart failure) ([place])   [facility] [place], Megan P, DO   3 months ago Routine general medical examination at a health care facility   [facility], [place], DO   6 months ago Chronic obstructive pulmonary disease, unspecified COPD type ([place])   [place], Megan P, DO   8 months  ago Chronic obstructive pulmonary disease, unspecified COPD type ([place])   [place], Megan P, DO   9 months ago Umbilical hernia without obstruction and without gangrene   [facility] [employer], [place], DO       Future Appointments             In 3 months [place], Megan P, DO [place], [facility]   In 5 months  [facility], [facility]             QUEtiapine (SEROQUEL XR) 50 MG TB24 24 hr tablet 90 tablet 0    Sig: Take 1 tablet (50 mg total) by mouth at bedtime.     Not Delegated - Psychiatry:  Antipsychotics - Second Generation (Atypical) - quetiapine Failed - 08/14/2021  5:38 PM      Failed - This refill cannot be delegated      Failed - TSH in normal range and within 360 days    TSH  Date Value Ref Range Status  08/09/2021 5.270 (H) 0.450 - 4.500 uIU/mL Final          Failed - Lipid Panel in normal range within the last 12 months    Cholesterol, Total  Date Value Ref Range Status  08/09/2021 172 100 - 199 mg/dL Final   LDL Chol Calc (NIH)  Date Value Ref Range Status  08/09/2021 111 (H) 0 - 99 mg/dL Final   HDL  Date Value Ref Range Status  08/09/2021 42 >39 mg/dL Final   Triglycerides  Date Value Ref Range Status  08/09/2021 104 0 - 149 mg/dL Final         Failed - CBC within normal limits and completed in the last 12 months    WBC  Date Value Ref Range Status  08/09/2021 11.3 (H) 3.4 - 10.8 x10E3/uL Final  12/31/2018 9.4 4.0 - 10.5 K/uL Final   RBC  Date Value Ref Range Status  08/09/2021 5.14 4.14 - 5.80 x10E6/uL Final  12/31/2018 4.59 4.22 -  5.81 MIL/uL Final   Hemoglobin  Date Value Ref Range Status  08/09/2021 14.8 13.0 - 17.7 g/dL Final   Hematocrit  Date Value Ref Range Status  08/09/2021 45.1 37.5 - 51.0 % Final   MCHC  Date Value Ref Range Status  08/09/2021 32.8 31.5 - 35.7 g/dL Final  12/31/2018 31.9 30.0 - 36.0 g/dL Final   [facility]  Date Value Ref Range Status  08/09/2021 28.8 26.6 - 33.0 pg Final  12/31/2018 26.8 26.0 - 34.0 pg Final   MCV  Date Value Ref Range Status  08/09/2021 88 79 - 97 fL Final  03/05/2011 91.0 fL Final   No results found for: PLTCOUNTKUC, LABPLAT, POCPLA RDW  Date Value Ref Range Status  08/09/2021 14.6 11.6 - 15.4 % Final  03/05/2011 14.4  Final         Failed - CMP within normal limits and completed in the last 12 months    Albumin  Date Value Ref Range Status  08/09/2021 4.2 3.8 - 4.9 g/dL Final   Alkaline Phosphatase  Date Value Ref Range Status  08/09/2021 120 44 - 121 IU/L Final   ALT  Date Value Ref Range Status  08/09/2021 12 0 - 44 IU/L Final   AST  Date Value Ref Range Status  08/09/2021 13 0 - 40 IU/L Final   BUN  Date Value Ref Range Status  08/09/2021 16 6 - 24 mg/dL Final   Calcium  Date Value Ref Range Status  08/09/2021 9.3 8.7 - 10.2 mg/dL Final  03/05/2011 9.3 mg/dL Final   CO2  Date Value Ref Range Status  08/09/2021 26 20 - 29 mmol/L Final   Creat  Date Value Ref Range Status  03/05/2011 0.77  Final   Creatinine, Ser  Date Value Ref Range Status  08/09/2021 0.85 0.76 - 1.27 mg/dL Final   Glucose  Date Value Ref Range Status  08/09/2021 91 70 - 99 mg/dL Final   Glucose, Bld  Date Value Ref Range Status  12/31/2018 186 (H) 70 - 99 mg/dL Final   Potassium  Date Value Ref Range Status  08/09/2021 4.3 3.5 - 5.2 mmol/L Final  03/05/2011 4.7 mmol/L Final   Sodium  Date Value Ref Range Status  08/09/2021 127 (L) 134 - 144 mmol/L Final   Bilirubin Total  Date Value Ref Range Status  08/09/2021 0.9 0.0 - 1.2 mg/dL Final    Protein,UA  Date Value Ref Range Status  05/11/2021 Negative Negative/Trace Final   Total Protein  Date Value Ref Range Status  08/09/2021 7.3 6.0 - 8.5 g/dL Final   GFR calc Af Amer  Date Value Ref Range Status  12/31/2018 >60 >60 mL/min Final   eGFR  Date Value Ref Range Status  08/09/2021 101 >59 mL/min/1.73 Final   GFR calc non Af Amer  Date Value Ref Range Status  12/31/2018 >60 >60 mL/min Final         Passed - Last BP in normal range    BP Readings from Last 1 Encounters:  05/11/21 110/71          Passed - Last Heart Rate in normal range    Pulse Readings from Last 1 Encounters:  05/11/21 76          Passed - Valid encounter within last 6 months    Recent Outpatient Visits           6 days ago Chronic systolic CHF (congestive heart failure) ([place])   [facility] [place], Megan P, DO   3 months ago Routine general medical examination at a health care facility   [facility] [place], DO   6 months ago Chronic obstructive pulmonary disease, unspecified COPD type ([place])   [place], Megan P, DO   8 months ago Chronic obstructive pulmonary disease, unspecified COPD type ([place])   [place], Megan P, DO   9 months ago Umbilical hernia without obstruction and without gangrene   [place], Barb Merino, DO       Future Appointments             In 3 months [place], Barb Merino, DO [place], [place]   In 5 months  [facility], [place]

## 2021-08-15 NOTE — Telephone Encounter (Signed)
Chad [place]- can we see about changing this referral to [facility]? ?

## 2021-08-16 ENCOUNTER — Other Ambulatory Visit: Payer: Self-pay | Admitting: Family Medicine

## 2021-08-16 MED ORDER — ASPIRIN LOW DOSE 81 MG PO TBEC: 81.0000 mg | DELAYED_RELEASE_TABLET | Freq: Every day | ORAL | 4 refills | Status: DC

## 2021-08-20 DIAGNOSIS — R0902 Hypoxemia: Secondary | ICD-10-CM | POA: Diagnosis not present

## 2021-08-23 ENCOUNTER — Other Ambulatory Visit: Payer: Self-pay | Admitting: Family Medicine

## 2021-08-24 NOTE — Telephone Encounter (Signed)
Requested medication (s) are due for refill today:no ? ?Requested medication (s) are on the active medication list: yes   ? ?Last refill: 08/15/21  #90  0 refills ? ?Future visit scheduled yes 11/13/21 ? ?Notes to clinic: Receipt confirmed by pharmacy 08/15/21 Attempted to reach pt, left VM. Attempted to reach pharmacy at [phone], message states "closed"   Unable to refuse non delegated meds. ? ?Requested Prescriptions  ?Pending Prescriptions Disp Refills  ? QUEtiapine (SEROQUEL XR) 50 MG TB24 24 hr tablet [Pharmacy Med Name: QUETIAPINE FUMARATE ER 50 MG TAB] 90 tablet 0  ?  Sig: TAKE 1 TABLET BY MOUTH AT BEDTIME  ?  ? Not Delegated - Psychiatry:  Antipsychotics - Second Generation (Atypical) - quetiapine Failed - 08/23/2021  3:24 PM  ?  ?  Failed - This refill cannot be delegated  ?  ?  Failed - TSH in normal range and within 360 days  ?  TSH  ?Date Value Ref Range Status  ?08/09/2021 5.270 (H) 0.450 - 4.500 uIU/mL Final  ?  ?  ?  ?  Failed - Lipid Panel in normal range within the last 12 months  ?  Cholesterol, Total  ?Date Value Ref Range Status  ?08/09/2021 172 100 - 199 mg/dL Final  ? ?LDL Chol Calc (NIH)  ?Date Value Ref Range Status  ?08/09/2021 111 (H) 0 - 99 mg/dL Final  ? ?HDL  ?Date Value Ref Range Status  ?08/09/2021 42 >39 mg/dL Final  ? ?Triglycerides  ?Date Value Ref Range Status  ?08/09/2021 104 0 - 149 mg/dL Final  ? ?  ?  ?  Passed - Last BP in normal range  ?  BP Readings from Last 1 Encounters:  ?05/11/21 110/71  ?  ?  ?  ?  Passed - Last Heart Rate in normal range  ?  Pulse Readings from Last 1 Encounters:  ?05/11/21 76  ?  ?  ?  ?  Passed - Valid encounter within last 6 months  ?  Recent Outpatient Visits   ? ?      ? 2 weeks ago Chronic systolic CHF (congestive heart failure) (HCC)  ? [facility] [place], Megan P, DO  ? 3 months ago Routine general medical examination at a health care facility  ? [facility] [place], Megan P, DO  ? 7 months ago Chronic obstructive pulmonary  disease, unspecified COPD type (HCC)  ? [facility] [place], Megan P, DO  ? 9 months ago Chronic obstructive pulmonary disease, unspecified COPD type (HCC)  ? [facility] [place], Megan P, DO  ? 10 months ago Umbilical hernia without obstruction and without gangrene  ? [facility] [place], Megan P, DO  ? ?  ?  ?Future Appointments   ? ?        ? In 2 months Johnson, Megan P, DO [facility]  ? In 5 months  [facility]  ? ?  ? ?  ?  ?  Passed - CBC within normal limits and completed in the last 12 months  ?  WBC  ?Date Value Ref Range Status  ?08/09/2021 11.3 (H) 3.4 - 10.8 x10E3/uL Final  ?12/31/2018 9.4 4.0 - 10.5 K/uL Final  ? ?RBC  ?Date Value Ref Range Status  ?08/09/2021 5.14 4.14 - 5.80 x10E6/uL Final  ?12/31/2018 4.59 4.22 - 5.81 MIL/uL Final  ? ?Hemoglobin  ?Date Value Ref Range Status  ?08/09/2021 14.8 13.0 - 17.7   g/dL Final  ? ?Hematocrit  ?Date Value Ref Range Status  ?08/09/2021 45.1 37.5 - 51.0 % Final  ? ?MCHC  ?Date Value Ref Range Status  ?08/09/2021 32.8 31.5 - 35.7 g/dL Final  ?12/31/2018 31.9 30.0 - 36.0 g/dL Final  ? ?MCH  ?Date Value Ref Range Status  ?08/09/2021 28.8 26.6 - 33.0 pg Final  ?12/31/2018 26.8 26.0 - 34.0 pg Final  ? ?MCV  ?Date Value Ref Range Status  ?08/09/2021 88 79 - 97 fL Final  ?03/05/2011 91.0 fL Final  ? ?No results found for: PLTCOUNTKUC, LABPLAT, [place] ?RDW  ?Date Value Ref Range Status  ?08/09/2021 14.6 11.6 - 15.4 % Final  ?03/05/2011 14.4  Final  ? ?  ?  ?  Passed - CMP within normal limits and completed in the last 12 months  ?  Albumin  ?Date Value Ref Range Status  ?08/09/2021 4.2 3.8 - 4.9 g/dL Final  ? ?Alkaline Phosphatase  ?Date Value Ref Range Status  ?08/09/2021 120 44 - 121 IU/L Final  ? ?ALT  ?Date Value Ref Range Status  ?08/09/2021 12 0 - 44 IU/L Final  ? ?AST  ?Date Value Ref Range Status  ?08/09/2021 13 0 - 40 IU/L Final  ? ?BUN  ?Date Value Ref Range Status  ?08/09/2021 16 6 - 24  mg/dL Final  ? ?Calcium  ?Date Value Ref Range Status  ?08/09/2021 9.3 8.7 - 10.2 mg/dL Final  ?03/05/2011 9.3 mg/dL Final  ? ?CO2  ?Date Value Ref Range Status  ?08/09/2021 26 20 - 29 mmol/L Final  ? ?Creat  ?Date Value Ref Range Status  ?03/05/2011 0.77  Final  ? ?Creatinine, Ser  ?Date Value Ref Range Status  ?08/09/2021 0.85 0.76 - 1.27 mg/dL Final  ? ?Glucose  ?Date Value Ref Range Status  ?08/09/2021 91 70 - 99 mg/dL Final  ? ?Glucose, Bld  ?Date Value Ref Range Status  ?12/31/2018 186 (H) 70 - 99 mg/dL Final  ? ?Potassium  ?Date Value Ref Range Status  ?08/09/2021 4.3 3.5 - 5.2 mmol/L Final  ?03/05/2011 4.7 mmol/L Final  ? ?Sodium  ?Date Value Ref Range Status  ?08/09/2021 127 (L) 134 - 144 mmol/L Final  ? ?Bilirubin Total  ?Date Value Ref Range Status  ?08/09/2021 0.9 0.0 - 1.2 mg/dL Final  ? ?Protein,UA  ?Date Value Ref Range Status  ?05/11/2021 Negative Negative/Trace Final  ? ?Total Protein  ?Date Value Ref Range Status  ?08/09/2021 7.3 6.0 - 8.5 g/dL Final  ? ?GFR calc Af Amer  ?Date Value Ref Range Status  ?12/31/2018 >60 >60 mL/min Final  ? ?eGFR  ?Date Value Ref Range Status  ?08/09/2021 101 >59 mL/min/1.73 Final  ? ?GFR calc non Af Amer  ?Date Value Ref Range Status  ?12/31/2018 >60 >60 mL/min Final  ? ?  ?  ?  ? ? ? ? ?

## 2021-09-01 ENCOUNTER — Telehealth: Payer: Self-pay | Admitting: Family Medicine

## 2021-09-01 NOTE — Telephone Encounter (Signed)
Patient would like to be referred to [facility] for his  hernia sugary he  give provider telephone number for [facility].  [phone] Please advise  ?

## 2021-09-01 NOTE — Telephone Encounter (Signed)
Jessie, can you look into this please? 

## 2021-09-01 NOTE — Telephone Encounter (Signed)
Patient came into the office gave [facility] telephone number for the provider to refer hime to get his   ?

## 2021-09-20 DIAGNOSIS — R0902 Hypoxemia: Secondary | ICD-10-CM | POA: Diagnosis not present

## 2021-10-17 ENCOUNTER — Encounter: Payer: Self-pay | Admitting: Family Medicine

## 2021-10-20 DIAGNOSIS — R0902 Hypoxemia: Secondary | ICD-10-CM | POA: Diagnosis not present

## 2021-11-01 ENCOUNTER — Other Ambulatory Visit: Payer: Self-pay | Admitting: Family Medicine

## 2021-11-02 NOTE — Telephone Encounter (Signed)
Requested Prescriptions  Pending Prescriptions Disp Refills  . furosemide (LASIX) 40 MG tablet [Pharmacy Med Name: FUROSEMIDE 40 MG TAB] 90 tablet 0    Sig: TAKE 1 TABLET BY MOUTH ONCE DAILY     Cardiovascular:  Diuretics - Loop Failed - 11/01/2021  1:34 PM      Failed - Na in normal range and within 180 days    Sodium  Date Value Ref Range Status  08/09/2021 127 (L) 134 - 144 mmol/L Final         Failed - Mg Level in normal range and within 180 days    Magnesium  Date Value Ref Range Status  12/31/2018 2.1 1.7 - 2.4 mg/dL Final    Comment:    Performed at [facility], [address] [place], [place] [address]         Passed - K in normal range and within 180 days    Potassium  Date Value Ref Range Status  08/09/2021 4.3 3.5 - 5.2 mmol/L Final  03/05/2011 4.7 mmol/L Final         Passed - Ca in normal range and within 180 days    Calcium  Date Value Ref Range Status  08/09/2021 9.3 8.7 - 10.2 mg/dL Final  28/31/5176 9.3 mg/dL Final         Passed - Cr in normal range and within 180 days    Creat  Date Value Ref Range Status  03/05/2011 0.77  Final   Creatinine, Ser  Date Value Ref Range Status  08/09/2021 0.85 0.76 - 1.27 mg/dL Final         Passed - Cl in normal range and within 180 days    Chloride  Date Value Ref Range Status  08/09/2021 98 96 - 106 mmol/L Final  03/05/2011 100 mmol/L Final         Passed - Last BP in normal range    BP Readings from Last 1 Encounters:  05/11/21 110/71         Passed - Valid encounter within last 6 months    Recent Outpatient Visits          2 months ago Chronic systolic CHF (congestive heart failure) (HCC)   [facility] [place], Megan P, DO   5 months ago Routine general medical examination at a health care facility   [facility], [place] P, DO   9 months ago Chronic obstructive pulmonary disease, unspecified COPD type (HCC)   [facility] [place], Megan P, DO    11 months ago Chronic obstructive pulmonary disease, unspecified COPD type (HCC)   [facility] [place], Megan P, DO   1 year ago Umbilical hernia without obstruction and without gangrene   [facility] Dorcas Carrow, DO      Future Appointments            In 2 weeks Laural Benes, Oralia Rud, DO [facility]   In 2 months  [facility]

## 2021-11-13 ENCOUNTER — Ambulatory Visit: Payer: Medicare HMO | Admitting: Family Medicine

## 2021-11-20 ENCOUNTER — Ambulatory Visit: Payer: Medicare HMO | Admitting: Family Medicine

## 2021-11-20 DIAGNOSIS — R0902 Hypoxemia: Secondary | ICD-10-CM | POA: Diagnosis not present

## 2021-12-20 DIAGNOSIS — R0902 Hypoxemia: Secondary | ICD-10-CM | POA: Diagnosis not present

## 2022-01-20 DIAGNOSIS — R0902 Hypoxemia: Secondary | ICD-10-CM | POA: Diagnosis not present

## 2022-01-29 ENCOUNTER — Ambulatory Visit: Payer: Medicare HMO

## 2022-02-15 ENCOUNTER — Other Ambulatory Visit: Payer: Self-pay | Admitting: Family Medicine

## 2022-02-16 NOTE — Telephone Encounter (Signed)
Requested medication (s) are due for refill today: yes  Requested medication (s) are on the active medication list: yes  Last refill:  Seroquel 08/15/21 #90 with 0 RF, Lasix 11/02/21 # 90 with 0 RF  Future visit scheduled: no, canceled 11/20/21, seen 08/09/21  Notes to clinic:  For Seroquel it is non delegated, for Lasix the Mg+ is from 2020, please assess.     Requested Prescriptions  Pending Prescriptions Disp Refills   furosemide (LASIX) 40 MG tablet [Pharmacy Med Name: FUROSEMIDE 40 MG TAB] 90 tablet 0    Sig: TAKE 1 TABLET BY MOUTH ONCE DAILY     Cardiovascular:  Diuretics - Loop Failed - 02/15/2022 11:35 AM      Failed - K in normal range and within 180 days    Potassium  Date Value Ref Range Status  08/09/2021 4.3 3.5 - 5.2 mmol/L Final  03/05/2011 4.7 mmol/L Final         Failed - Ca in normal range and within 180 days    Calcium  Date Value Ref Range Status  08/09/2021 9.3 8.7 - 10.2 mg/dL Final  03/05/2011 9.3 mg/dL Final         Failed - Na in normal range and within 180 days    Sodium  Date Value Ref Range Status  08/09/2021 127 (L) 134 - 144 mmol/L Final         Failed - Cr in normal range and within 180 days    Creat  Date Value Ref Range Status  03/05/2011 0.77  Final   Creatinine, Ser  Date Value Ref Range Status  08/09/2021 0.85 0.76 - 1.27 mg/dL Final         Failed - Cl in normal range and within 180 days    Chloride  Date Value Ref Range Status  08/09/2021 98 96 - 106 mmol/L Final  03/05/2011 100 mmol/L Final         Failed - Mg Level in normal range and within 180 days    Magnesium  Date Value Ref Range Status  12/31/2018 2.1 1.7 - 2.4 mg/dL Final    Comment:    Performed at [facility], [address] [place],  [address]         Passed - Last BP in normal range    BP Readings from Last 1 Encounters:  05/11/21 110/71         Passed - Valid encounter within last 6 months    Recent Outpatient Visits           6  months ago Chronic systolic CHF (congestive heart failure) ([place])   [facility] [place], Megan P, DO   9 months ago Routine general medical examination at a health care facility   [place], [place] P, DO   1 year ago Chronic obstructive pulmonary disease, unspecified COPD type [facility])   [place], Megan P, DO   1 year ago Chronic obstructive pulmonary disease, unspecified COPD type ([place])   [place], Megan P, DO   1 year ago Umbilical hernia without obstruction and without gangrene   [facility], Megan P, DO               QUEtiapine (SEROQUEL XR) 50 MG TB24 24 hr tablet [Pharmacy Med Name: QUETIAPINE FUMARATE ER 50 MG TAB] 90 tablet 0    Sig: TAKE 1 TABLET BY MOUTH AT BEDTIME  Not Delegated - Psychiatry:  Antipsychotics - Second Generation (Atypical) - quetiapine Failed - 02/15/2022 11:35 AM      Failed - This refill cannot be delegated      Failed - TSH in normal range and within 360 days    TSH  Date Value Ref Range Status  08/09/2021 5.270 (H) 0.450 - 4.500 uIU/mL Final         Failed - Lipid Panel in normal range within the last 12 months    Cholesterol, Total  Date Value Ref Range Status  08/09/2021 172 100 - 199 mg/dL Final   LDL Chol Calc (NIH)  Date Value Ref Range Status  08/09/2021 111 (H) 0 - 99 mg/dL Final   HDL  Date Value Ref Range Status  08/09/2021 42 >39 mg/dL Final   Triglycerides  Date Value Ref Range Status  08/09/2021 104 0 - 149 mg/dL Final         Passed - Last BP in normal range    BP Readings from Last 1 Encounters:  05/11/21 110/71         Passed - Last Heart Rate in normal range    Pulse Readings from Last 1 Encounters:  05/11/21 76         Passed - Valid encounter within last 6 months    Recent Outpatient Visits           6 months ago Chronic systolic CHF (congestive heart failure) ([place])   [facility] [place],  Megan P, DO   9 months ago Routine general medical examination at a health care facility   [facility], [place] P, DO   1 year ago Chronic obstructive pulmonary disease, unspecified COPD type ([place])   [place], Megan P, DO   1 year ago Chronic obstructive pulmonary disease, unspecified COPD type ([place])   [place], Megan P, DO   1 year ago Umbilical hernia without obstruction and without gangrene   [facility] [place], Megan P, DO              Passed - CBC within normal limits and completed in the last 12 months    WBC  Date Value Ref Range Status  08/09/2021 11.3 (H) 3.4 - 10.8 x10E3/uL Final  12/31/2018 9.4 4.0 - 10.5 K/uL Final   RBC  Date Value Ref Range Status  08/09/2021 5.14 4.14 - 5.80 x10E6/uL Final  12/31/2018 4.59 4.22 - 5.81 MIL/uL Final   Hemoglobin  Date Value Ref Range Status  08/09/2021 14.8 13.0 - 17.7 g/dL Final   Hematocrit  Date Value Ref Range Status  08/09/2021 45.1 37.5 - 51.0 % Final   MCHC  Date Value Ref Range Status  08/09/2021 32.8 31.5 - 35.7 g/dL Final  12/31/2018 31.9 30.0 - 36.0 g/dL Final   [facility]  Date Value Ref Range Status  08/09/2021 28.8 26.6 - 33.0 pg Final  12/31/2018 26.8 26.0 - 34.0 pg Final   MCV  Date Value Ref Range Status  08/09/2021 88 79 - 97 fL Final  03/05/2011 91.0 fL Final   No results found for: "PLTCOUNTKUC", "LABPLAT", "POCPLA" RDW  Date Value Ref Range Status  08/09/2021 14.6 11.6 - 15.4 % Final  03/05/2011 14.4  Final         Passed - CMP within normal limits and completed in the last 12 months    Albumin  Date Value Ref Range Status  08/09/2021 4.2 3.8 - 4.9  g/dL Final   Alkaline Phosphatase  Date Value Ref Range Status  08/09/2021 120 44 - 121 IU/L Final   ALT  Date Value Ref Range Status  08/09/2021 12 0 - 44 IU/L Final   AST  Date Value Ref Range Status  08/09/2021 13 0 - 40 IU/L Final   BUN  Date Value Ref  Range Status  08/09/2021 16 6 - 24 mg/dL Final   Calcium  Date Value Ref Range Status  08/09/2021 9.3 8.7 - 10.2 mg/dL Final  03/05/2011 9.3 mg/dL Final   CO2  Date Value Ref Range Status  08/09/2021 26 20 - 29 mmol/L Final   Creat  Date Value Ref Range Status  03/05/2011 0.77  Final   Creatinine, Ser  Date Value Ref Range Status  08/09/2021 0.85 0.76 - 1.27 mg/dL Final   Glucose  Date Value Ref Range Status  08/09/2021 91 70 - 99 mg/dL Final   Glucose, Bld  Date Value Ref Range Status  12/31/2018 186 (H) 70 - 99 mg/dL Final   Potassium  Date Value Ref Range Status  08/09/2021 4.3 3.5 - 5.2 mmol/L Final  03/05/2011 4.7 mmol/L Final   Sodium  Date Value Ref Range Status  08/09/2021 127 (L) 134 - 144 mmol/L Final   Bilirubin Total  Date Value Ref Range Status  08/09/2021 0.9 0.0 - 1.2 mg/dL Final   Protein,UA  Date Value Ref Range Status  05/11/2021 Negative Negative/Trace Final   Total Protein  Date Value Ref Range Status  08/09/2021 7.3 6.0 - 8.5 g/dL Final   GFR calc Af Amer  Date Value Ref Range Status  12/31/2018 >60 >60 mL/min Final   eGFR  Date Value Ref Range Status  08/09/2021 101 >59 mL/min/1.73 Final   GFR calc non Af Amer  Date Value Ref Range Status  12/31/2018 >60 >60 mL/min Final

## 2022-02-18 NOTE — Telephone Encounter (Signed)
Needs appt

## 2022-02-19 NOTE — Telephone Encounter (Signed)
LVM asking patient to call back to schedule an appointment. Patient was supposed to follow up in June.

## 2022-02-20 DIAGNOSIS — R0902 Hypoxemia: Secondary | ICD-10-CM | POA: Diagnosis not present

## 2022-02-21 NOTE — Telephone Encounter (Signed)
2nd attempt to reach patient to schedule an appointment 

## 2022-02-27 ENCOUNTER — Other Ambulatory Visit: Payer: Self-pay | Admitting: Family Medicine

## 2022-02-27 NOTE — Telephone Encounter (Signed)
Pt called in to request refill on 2 medications. Pt says that request has been previously sent from pharmacy.  ENTRESTO 24-26 MG   FARXIGA 10 MG TABS tablet    Pharmacy:  [place], [place]. Phone:  [phone]  Fax:  [phone]      Past Appt: 08/09/21

## 2022-02-28 NOTE — Telephone Encounter (Signed)
Requested medication (s) are due for refill today: last ordered 2020 (not automated, the pt has requested refills)  Requested medication (s) are on the active medication list: yes  Last refill:  Entresto 01/07/2019, Farxiga 01/22/2019 no details due to historical provider  Future visit scheduled: no, seen 08/09/2021  Notes to clinic:  Entresto does not have protocol, both rx from historical provider from 2020, please assess.    Requested Prescriptions  Pending Prescriptions Disp Refills   ENTRESTO 24-26 MG 60 tablet     Sig: Take 1 tablet by mouth 2 (two) times daily.     Off-Protocol Failed - 02/27/2022  2:03 PM      Failed - Medication not assigned to a protocol, review manually.      Passed - Valid encounter within last 12 months    Recent Outpatient Visits           6 months ago Chronic systolic CHF (congestive heart failure) ([place])   [place], Megan P, DO   9 months ago Routine general medical examination at a health care facility   [facility] [place], [place] P, DO   1 year ago Chronic obstructive pulmonary disease, unspecified COPD type [facility])   [place], Megan P, DO   1 year ago Chronic obstructive pulmonary disease, unspecified COPD type [facility])   [place], Megan P, DO   1 year ago Umbilical hernia without obstruction and without gangrene   [facility] [place], Megan P, DO               FARXIGA 10 MG TABS tablet 30 tablet     Sig: Take 1 tablet (10 mg total) by mouth daily.     Endocrinology:  Diabetes - SGLT2 Inhibitors Failed - 02/27/2022  2:03 PM      Failed - HBA1C is between 0 and 7.9 and within 180 days    HB A1C (BAYER DCA - WAIVED)  Date Value Ref Range Status  05/11/2021 5.8 (H) 4.8 - 5.6 % Final    Comment:             Prediabetes: 5.7 - 6.4          Diabetes: >6.4          Glycemic control for adults with diabetes: <7.0          Passed - Cr in normal  range and within 360 days    Creat  Date Value Ref Range Status  03/05/2011 0.77  Final   Creatinine, Ser  Date Value Ref Range Status  08/09/2021 0.85 0.76 - 1.27 mg/dL Final         Passed - eGFR in normal range and within 360 days    GFR calc Af Amer  Date Value Ref Range Status  12/31/2018 >60 >60 mL/min Final   GFR calc non Af Amer  Date Value Ref Range Status  12/31/2018 >60 >60 mL/min Final   eGFR  Date Value Ref Range Status  08/09/2021 101 >59 mL/min/1.73 Final         Passed - Valid encounter within last 6 months    Recent Outpatient Visits           6 months ago Chronic systolic CHF (congestive heart failure) ([place])   [facility] [place], Megan P, DO   9 months ago Routine general medical examination at a health care facility   [facility] [place], DO  1 year ago Chronic obstructive pulmonary disease, unspecified COPD type ([place])   [place], Megan P, DO   1 year ago Chronic obstructive pulmonary disease, unspecified COPD type ([place])   [place], Megan P, DO   1 year ago Umbilical hernia without obstruction and without gangrene   [place], Megan P, DO

## 2022-03-22 DIAGNOSIS — R0902 Hypoxemia: Secondary | ICD-10-CM | POA: Diagnosis not present

## 2022-04-22 DIAGNOSIS — R0902 Hypoxemia: Secondary | ICD-10-CM | POA: Diagnosis not present

## 2022-08-06 DIAGNOSIS — G4733 Obstructive sleep apnea (adult) (pediatric): Secondary | ICD-10-CM | POA: Diagnosis not present

## 2022-08-06 DIAGNOSIS — Z720 Tobacco use: Secondary | ICD-10-CM | POA: Diagnosis not present

## 2022-08-06 DIAGNOSIS — I2510 Atherosclerotic heart disease of native coronary artery without angina pectoris: Secondary | ICD-10-CM | POA: Diagnosis not present

## 2022-08-06 DIAGNOSIS — I350 Nonrheumatic aortic (valve) stenosis: Secondary | ICD-10-CM | POA: Diagnosis not present

## 2022-08-06 DIAGNOSIS — E782 Mixed hyperlipidemia: Secondary | ICD-10-CM | POA: Diagnosis not present

## 2022-08-06 DIAGNOSIS — J449 Chronic obstructive pulmonary disease, unspecified: Secondary | ICD-10-CM | POA: Diagnosis not present

## 2022-08-06 DIAGNOSIS — I5022 Chronic systolic (congestive) heart failure: Secondary | ICD-10-CM | POA: Diagnosis not present

## 2022-08-06 DIAGNOSIS — R0602 Shortness of breath: Secondary | ICD-10-CM | POA: Diagnosis not present

## 2022-08-06 DIAGNOSIS — I700 Atherosclerosis of aorta: Secondary | ICD-10-CM | POA: Diagnosis not present

## 2022-08-21 DIAGNOSIS — I5022 Chronic systolic (congestive) heart failure: Secondary | ICD-10-CM | POA: Diagnosis not present

## 2022-08-21 DIAGNOSIS — I350 Nonrheumatic aortic (valve) stenosis: Secondary | ICD-10-CM | POA: Diagnosis not present

## 2022-08-21 DIAGNOSIS — R0602 Shortness of breath: Secondary | ICD-10-CM | POA: Diagnosis not present

## 2022-08-27 ENCOUNTER — Telehealth: Payer: Self-pay | Admitting: Family Medicine

## 2022-08-27 NOTE — Telephone Encounter (Signed)
Copied from [place] [id number]. Topic: General - Other >> Aug 27, 2022  9:57 AM [place] B wrote: Reason for CRM: Patient called in with questions for Dr Durenda Age nurse, wouldn't give details

## 2022-09-03 ENCOUNTER — Telehealth: Payer: Self-pay | Admitting: Family Medicine

## 2022-09-03 NOTE — Telephone Encounter (Signed)
Contacted [name] to schedule their annual wellness visit. Appointment made for 09/25/2022.  Sherol Dade; Care Guide Ambulatory [facility] [place] [facility] Direct Dial: [phone]

## 2022-09-03 NOTE — Telephone Encounter (Signed)
Copied from [place] [id number]. Topic: Medicare AWV >> Sep 03, 2022 11:30 AM Devoria Glassing wrote: Reason for CRM: Called patient to reschedule Medicare Annual Wellness Visit (AWV). Left message for patient to call back and reschedule Medicare Annual Wellness Visit (AWV).  Last date of AWV: 01/27/2021  Please schedule an appointment at any time with Kirke Shaggy, LPN after [id number] .  If any questions, please contact me.  Thank you ,  Sherol Dade; [place] Direct Dial: [phone]

## 2022-09-10 DEATH — deceased

## 2022-09-19 ENCOUNTER — Ambulatory Visit: Payer: Medicare HMO | Admitting: Family Medicine

## 2022-09-21 ENCOUNTER — Telehealth: Payer: Self-pay

## 2022-09-21 NOTE — Telephone Encounter (Signed)
Copied from CRM [id number]. Topic: General - Other >> Sep 21, 2022  3:50 PM Patsy Lager T wrote: Reason for CRM: patients brother Gibril Erkkila called stated patient passed away on 2022/10/01. Need to cancel his 09/25/22 appt

## 2022-09-26 ENCOUNTER — Ambulatory Visit: Payer: Medicare HMO | Admitting: Family Medicine
